# Patient Record
Sex: Male | Born: 1955 | Race: White | Hispanic: No | Marital: Married | Smoking: Light tobacco smoker
Health system: Southern US, Community
[De-identification: ages and names within clinical notes are randomized; demographics above are authoritative.]

## PROBLEM LIST (undated history)

## (undated) DIAGNOSIS — I10 Essential (primary) hypertension: Secondary | ICD-10-CM

## (undated) DIAGNOSIS — G459 Transient cerebral ischemic attack, unspecified: Secondary | ICD-10-CM

## (undated) DIAGNOSIS — C61 Malignant neoplasm of prostate: Secondary | ICD-10-CM

## (undated) DIAGNOSIS — Z973 Presence of spectacles and contact lenses: Secondary | ICD-10-CM

## (undated) HISTORY — DX: Essential (primary) hypertension: I10

## (undated) HISTORY — PX: PROSTATE BIOPSY: SHX241

---

## 2005-12-16 ENCOUNTER — Encounter: Admission: RE | Admit: 2005-12-16 | Discharge: 2005-12-16 | Payer: Self-pay | Admitting: Internal Medicine

## 2012-08-24 ENCOUNTER — Ambulatory Visit: Payer: Self-pay | Admitting: Family Medicine

## 2012-08-24 VITALS — BP 166/98 | HR 104 | Temp 98.1°F | Resp 20 | Ht 70.0 in | Wt 190.6 lb

## 2012-08-24 DIAGNOSIS — I10 Essential (primary) hypertension: Secondary | ICD-10-CM | POA: Insufficient documentation

## 2012-08-24 NOTE — Progress Notes (Signed)
@  [email or web address]@  Patient ID: SAL SPRATLEY MRN: [id number], DOB: October 13, 1956, 56 y.o. Date of Encounter: 08/24/2012, 11:56 AM  Primary Physician: No primary provider on file.  Chief Complaint: HTN  HPI: 56 y.o. year old male with history below presents for hypertension follow up.  No CP, HA, visual changes, or focal deficits.   Past Medical History  Diagnosis Date  . Hypertension      Home Meds: Prior to Admission medications   Not on File    Allergies: Not on File  History   Social History  . Marital Status: Married    Spouse Name: N/A    Number of Children: N/A  . Years of Education: N/A   Occupational History  . Not on file.   Social History Main Topics  . Smoking status: Light Tobacco Smoker -- 0.1 packs/day    Types: Cigarettes  . Smokeless tobacco: Not on file  . Alcohol Use: No  . Drug Use: No  . Sexually Active: Not on file   Other Topics Concern  . Not on file   Social History Narrative  . No narrative on file     Family History  Problem Relation Age of Onset  . Cancer Mother     Review of Systems: Constitutional: negative for chills, fever, night sweats, weight changes, or fatigue  HEENT: negative for vision changes, hearing loss, congestion, rhinorrhea, ST, epistaxis, or sinus pressure Cardiovascular: negative for chest pain, palpitations, or DOE Respiratory: negative for hemoptysis, wheezing, shortness of breath, or cough Abdominal: negative for abdominal pain, nausea, vomiting, diarrhea, or constipation Dermatological: negative for rash Neurologic: negative for headache, dizziness, or syncope All other systems reviewed and are otherwise negative with the exception to those above and in the HPI.   Physical Exam:  160/105 Blood pressure 166/98, pulse 104, temperature 98.1 F (36.7 C), temperature source Oral, resp. rate 20, height 5\' 10"  (1.778 m), weight 190 lb 9.6 oz (86.456 kg), SpO2 98.00%., Body mass index is 27.35 kg/(m^2). General:  Well developed, well nourished, in no acute distress. Head: Normocephalic, atraumatic, eyes without discharge, sclera non-icteric, nares are without discharge. Bilateral auditory canals clear, TM's are without perforation, pearly grey and translucent with reflective cone of light bilaterally. Oral cavity moist, posterior pharynx without exudate, erythema, peritonsillar abscess, or post nasal drip.  Neck: Supple. No thyromegaly. Full ROM. No lymphadenopathy. No carotid bruits. Lungs: Clear bilaterally to auscultation without wheezes, rales, or rhonchi. Breathing is unlabored. Heart: RRR with S1 S2. No murmurs, rubs, or gallops appreciated.  Abdomen: Soft, non-tender, non-distended with normoactive bowel sounds. No hepatosplenomegaly. No rebound/guarding. No obvious abdominal masses. Msk:  Strength and tone normal for age. Extremities/Skin: Warm and dry. No clubbing or cyanosis. No edema. No rashes or suspicious lesions. Distal pulses 2+ and equal bilaterally. Neuro: Alert and oriented X 3. Moves all extremities spontaneously. Gait is normal. CNII-XII grossly in tact. DTR 2+, cerebellar function intact. Rhomberg normal. Psych:  Responds to questions appropriately with a normal affect.    ASSESSMENT AND PLAN:  56 y.o. year old male with hypertension, no insurance, not compliant -  Signed, Elvina Sidle, MD 08/24/2012 11:56 AM

## 2016-07-16 DIAGNOSIS — Z23 Encounter for immunization: Secondary | ICD-10-CM | POA: Diagnosis not present

## 2016-11-11 DIAGNOSIS — F17210 Nicotine dependence, cigarettes, uncomplicated: Secondary | ICD-10-CM | POA: Diagnosis not present

## 2016-11-11 DIAGNOSIS — I10 Essential (primary) hypertension: Secondary | ICD-10-CM | POA: Diagnosis not present

## 2016-11-11 DIAGNOSIS — E7800 Pure hypercholesterolemia, unspecified: Secondary | ICD-10-CM | POA: Diagnosis not present

## 2017-01-14 DIAGNOSIS — I10 Essential (primary) hypertension: Secondary | ICD-10-CM | POA: Diagnosis not present

## 2017-01-14 DIAGNOSIS — Z1389 Encounter for screening for other disorder: Secondary | ICD-10-CM | POA: Diagnosis not present

## 2017-01-14 DIAGNOSIS — Z0000 Encounter for general adult medical examination without abnormal findings: Secondary | ICD-10-CM | POA: Diagnosis not present

## 2017-01-14 DIAGNOSIS — E7800 Pure hypercholesterolemia, unspecified: Secondary | ICD-10-CM | POA: Diagnosis not present

## 2017-01-14 DIAGNOSIS — Z1159 Encounter for screening for other viral diseases: Secondary | ICD-10-CM | POA: Diagnosis not present

## 2017-03-03 DIAGNOSIS — R972 Elevated prostate specific antigen [PSA]: Secondary | ICD-10-CM | POA: Diagnosis not present

## 2017-03-14 DIAGNOSIS — R972 Elevated prostate specific antigen [PSA]: Secondary | ICD-10-CM | POA: Diagnosis not present

## 2017-05-19 DIAGNOSIS — C61 Malignant neoplasm of prostate: Secondary | ICD-10-CM | POA: Diagnosis not present

## 2017-08-11 DIAGNOSIS — C61 Malignant neoplasm of prostate: Secondary | ICD-10-CM | POA: Diagnosis not present

## 2017-08-11 DIAGNOSIS — I10 Essential (primary) hypertension: Secondary | ICD-10-CM | POA: Diagnosis not present

## 2017-08-11 DIAGNOSIS — E7800 Pure hypercholesterolemia, unspecified: Secondary | ICD-10-CM | POA: Diagnosis not present

## 2017-08-11 DIAGNOSIS — Z23 Encounter for immunization: Secondary | ICD-10-CM | POA: Diagnosis not present

## 2017-12-24 DIAGNOSIS — C61 Malignant neoplasm of prostate: Secondary | ICD-10-CM | POA: Diagnosis not present

## 2017-12-25 ENCOUNTER — Other Ambulatory Visit: Payer: Self-pay | Admitting: Urology

## 2017-12-25 DIAGNOSIS — C61 Malignant neoplasm of prostate: Secondary | ICD-10-CM

## 2018-01-08 ENCOUNTER — Ambulatory Visit
Admission: RE | Admit: 2018-01-08 | Discharge: 2018-01-08 | Disposition: A | Discharge status: Home / Self Care | Payer: BLUE CROSS/BLUE SHIELD | Source: Ambulatory Visit | Attending: Urology | Admitting: Urology

## 2018-01-08 DIAGNOSIS — C61 Malignant neoplasm of prostate: Secondary | ICD-10-CM

## 2018-01-08 MED ORDER — GADOBENATE DIMEGLUMINE 529 MG/ML IV SOLN
15.0000 mL | Freq: Once | INTRAVENOUS | Status: AC | PRN
  Administered 2018-01-08: 15 mL via INTRAVENOUS

## 2018-01-12 ENCOUNTER — Encounter: Payer: Self-pay | Admitting: Radiation Oncology

## 2018-01-19 DIAGNOSIS — G459 Transient cerebral ischemic attack, unspecified: Secondary | ICD-10-CM

## 2018-01-19 HISTORY — DX: Transient cerebral ischemic attack, unspecified: G45.9

## 2018-01-23 NOTE — Progress Notes (Signed)
GU Location of Tumor / Histology: prostatic adenocarcinoma  If Prostate Cancer, Gleason Score is (3 + 4) and PSA is (4.53). Prostate volume: 24.7 mL  12/24/2017 PSA  5.68  Alex Banks was originally diagnosed with prostate cancer 03/14/2017.  Biopsies of prostate (if applicable) revealed:    Past/Anticipated interventions by urology, if any: biopsy, MRI of prostate/pelvis,referral to radiation oncology (pt interested in brachytherapy)  Past/Anticipated interventions by medical oncology, if any: no  Weight changes, if any: no  Bowel/Bladder complaints, if any: IPSS 1. Denies dysuria, hematuria, leakage or incontinence  Nausea/Vomiting, if any: no  Pain issues, if any:  no  SAFETY ISSUES:  Prior radiation? no  Pacemaker/ICD? no  Possible current pregnancy? no  Is the patient on methotrexate? no  Current Complaints / other details:  62 year old male. Married 38 years. Lives in [place]. Works 60 hour weeks. No much downtime.  Reports he has delayed pursuing treatment because he is busy with work. Hx of bone cancer in his brother. Hx of liver cancer in his mother. Restaging biopsy needed?

## 2018-01-26 ENCOUNTER — Ambulatory Visit
Admission: RE | Admit: 2018-01-26 | Discharge: 2018-01-26 | Disposition: A | Discharge status: Home / Self Care | Payer: BLUE CROSS/BLUE SHIELD | Source: Ambulatory Visit | Attending: Radiation Oncology | Admitting: Radiation Oncology

## 2018-01-26 ENCOUNTER — Encounter: Payer: Self-pay | Admitting: Radiation Oncology

## 2018-01-26 ENCOUNTER — Encounter: Payer: Self-pay | Admitting: Medical Oncology

## 2018-01-26 ENCOUNTER — Other Ambulatory Visit: Payer: Self-pay

## 2018-01-26 VITALS — BP 229/122 | HR 111 | Temp 98.2°F | Resp 20 | Ht 70.0 in | Wt 187.0 lb

## 2018-01-26 DIAGNOSIS — C61 Malignant neoplasm of prostate: Secondary | ICD-10-CM | POA: Diagnosis not present

## 2018-01-26 DIAGNOSIS — Z800 Family history of malignant neoplasm of digestive organs: Secondary | ICD-10-CM | POA: Diagnosis not present

## 2018-01-26 DIAGNOSIS — Z923 Personal history of irradiation: Secondary | ICD-10-CM | POA: Insufficient documentation

## 2018-01-26 DIAGNOSIS — Z808 Family history of malignant neoplasm of other organs or systems: Secondary | ICD-10-CM | POA: Diagnosis not present

## 2018-01-26 DIAGNOSIS — R972 Elevated prostate specific antigen [PSA]: Secondary | ICD-10-CM | POA: Diagnosis not present

## 2018-01-26 HISTORY — DX: Malignant neoplasm of prostate: C61

## 2018-01-26 NOTE — Progress Notes (Signed)
See progress note under physician encounter. 

## 2018-01-26 NOTE — Progress Notes (Signed)
Introduced myself to Alex Banks and his wife as the prostate nurse navigator and my role. He had a prostate biopsy 01/02/17. He had PSA repeatd 12/24/17 and MRI that did not show metastatic disease. He is interested in brachytherapy. We discussed the procedure and what to expect. I gave them my card and asked them to call with questions and or concerns.

## 2018-01-26 NOTE — Progress Notes (Signed)
Radiation Oncology         [phone] ________________________________  Initial Outpatient Consultation  Name: Alex Banks MRN: [id number]  Date: 01/26/2018  DOB: 10-07-1956  HK:[id number], Denton Ar, MD  Franchot Gallo, MD   REFERRING PHYSICIAN: Franchot Gallo, MD  DIAGNOSIS: 62 y.o. 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 Malignant neoplasm of prostate ([place]) C61     HISTORY OF PRESENT ILLNESS: Alex Banks is a 62 y.o. male with a diagnosis of prostate cancer. He was noted to have an elevated PSA of 4.53 in March 2018 by his primary care physician, Dr. Wenda Low.  Accordingly, he was referred for evaluation in urology by Dr. Diona Fanti in May 2018, where digital rectal examination was performed at that time revealing a 4 mm right-sided prostate nodule. His PSA was rechecked and remained elevated at 4.49. The patient proceeded to transrectal ultrasound with 12 biopsies of the prostate on 03/14/2017.  The prostate volume measured 24.66 cc.  Out of 12 core biopsies, 5 were positive.  The maximum Gleason score was 3+4, and this was seen in the right mid, right apex, right base lateral, right mid lateral, and right apex lateral.     The patient reviewed the biopsy results and treatment options with Dr. Diona Fanti but has not received any treatment since his diagnosis in May 2018. He states that he works 60-hour weeks and has delayed pursuing treatment because he is so busy with work. He returned to Dr. Diona Fanti on 12/24/2017 for follow-up and was noted to have a 5 mm nodule in the right mid gland on DRE. He also had a PSA drawn at that time that was 5.68. He has not had a restaging biopsy but was evaluated with an MRI of the prostate on 01/08/2018 which showed a 16 mm lesion in the right posterolateral mid gland. There was no evidence of extracapsular extension, seminal vesicle  invasion, or metastatic disease. He has been referred by Dr. Diona Fanti today for discussion of potential radiation treatment options.   PREVIOUS RADIATION THERAPY: No  PAST MEDICAL HISTORY:  Past Medical History:  Diagnosis Date  . Hypertension   . Prostate cancer ([place])       PAST SURGICAL HISTORY: Past Surgical History:  Procedure Laterality Date  . PROSTATE BIOPSY      FAMILY HISTORY:  Family History  Problem Relation Age of Onset  . Cancer Mother        liver  . Cancer Brother        bone    SOCIAL HISTORY:  Social History   Socioeconomic History  . Marital status: Married    Spouse name: Not on file  . Number of children: Not on file  . Years of education: Not on file  . Highest education level: Not on file  Occupational History    Comment: manager  Social Needs  . Financial resource strain: Not on file  . Food insecurity:    Worry: Not on file    Inability: Not on file  . Transportation needs:    Medical: Not on file    Non-medical: Not on file  Tobacco Use  . Smoking status: Light Tobacco Smoker    Packs/day: 0.10    Years: 44.00    Pack years: 4.40    Types: Cigarettes  . Smokeless tobacco: Never Used  Substance and Sexual Activity  . Alcohol  use: No  . Drug use: No  . Sexual activity: Not on file  Lifestyle  . Physical activity:    Days per week: Not on file    Minutes per session: Not on file  . Stress: Not on file  Relationships  . Social connections:    Talks on phone: Not on file    Gets together: Not on file    Attends religious service: Not on file    Active member of club or organization: Not on file    Attends meetings of clubs or organizations: Not on file    Relationship status: Not on file  . Intimate partner violence:    Fear of current or ex partner: Not on file    Emotionally abused: Not on file    Physically abused: Not on file    Forced sexual activity: Not on file  Other Topics Concern  . Not on file  Social History  Narrative   Resides in [place]. One grown son in [place] and the other in [place]. No grandchildren.    ALLERGIES: Patient has no known allergies.  MEDICATIONS:  Current Outpatient Medications  Medication Sig Dispense Refill  . AMLODIPINE BESYLATE PO Take 10 mg by mouth daily.    Marland Kitchen atorvastatin (LIPITOR) 20 MG tablet Take 20 mg by mouth daily.    Marland Kitchen lisinopril-hydrochlorothiazide (PRINZIDE,ZESTORETIC) 20-25 MG tablet Take 1 tablet by mouth daily.     No current facility-administered medications for this encounter.     REVIEW OF SYSTEMS:  On review of systems, the patient reports that he is doing well overall. He denies any chest pain, shortness of breath, cough, fevers, chills, night sweats, or unintended weight changes. He denies any bowel disturbances, and denies abdominal pain, nausea or vomiting. He denies any new musculoskeletal or joint aches or pains. His IPSS was 1, indicating mild urinary symptoms. He denies any dysuria, hematuria, leakage or incontinence. He denies erectile dysfunction. A complete review of systems is obtained and is otherwise negative.    PHYSICAL EXAM:  Wt Readings from Last 3 Encounters:  01/26/18 187 lb (84.8 kg)  08/24/12 190 lb 9.6 oz (86.5 kg)   Temp Readings from Last 3 Encounters:  01/26/18 98.2 F (36.8 C) (Oral)  08/24/12 98.1 F (36.7 C) (Oral)   BP Readings from Last 3 Encounters:  01/26/18 (!) 229/122  08/24/12 (!) 166/98   Pulse Readings from Last 3 Encounters:  01/26/18 (!) 111  08/24/12 (!) 104   Pain Assessment Pain Score: 0-No pain/10  In general this is a well appearing caucasian male in no acute distress. He's alert and oriented x4 and appropriate throughout the examination. Cardiopulmonary assessment is negative for acute distress and he exhibits normal effort.    KPS = 100  100 - Normal; no complaints; no evidence of disease. 90   - Able to carry on normal activity; minor signs or symptoms of disease. 80   -  Normal activity with effort; some signs or symptoms of disease. 52   - Cares for self; unable to carry on normal activity or to do active work. 60   - Requires occasional assistance, but is able to care for most of his personal needs. 50   - Requires considerable assistance and frequent medical care. 70   - Disabled; requires special care and assistance. 40   - Severely disabled; hospital admission is indicated although death not imminent. 18   - Very sick; hospital admission necessary; active supportive treatment necessary.  10   - Moribund; fatal processes progressing rapidly. 0     - Dead  Karnofsky DA, Abelmann WH, Craver LS and Burchenal JH [phone]) The use of the nitrogen mustards in the palliative treatment of carcinoma: with particular reference to bronchogenic carcinoma Cancer 1 634-56  LABORATORY DATA:  No results found for: WBC, HGB, HCT, MCV, PLT No results found for: NA, K, CL, CO2 No results found for: ALT, AST, GGT, ALKPHOS, BILITOT   RADIOGRAPHY: Mr Prostate W Wo Contrast  Result Date: 01/08/2018 CLINICAL DATA:  Prostate cancer, Gleason score 7, extending from the right base to the apex EXAM: MR PROSTATE WITHOUT AND WITH CONTRAST TECHNIQUE: Multiplanar multisequence MRI images were obtained of the pelvis centered about the prostate. Pre and post contrast images were obtained. CONTRAST:  78mL MULTIHANCE GADOBENATE DIMEGLUMINE 529 MG/ML IV SOLN Creatinine was obtained on site at [facility] at [address] Results: Creatinine 1.1 mg/dL. COMPARISON:  None. FINDINGS: Prostate: 16 x 9 x 12 mm low T2 lesion in the right posterolateral mid gland (series 7/image 11), likely corresponding to the patient's known high-grade macroscopic prostate cancer. Associated early arterial enhancement (series 34/image 11). Possible mild restricted diffusion/low ADC towards the apex (series 11/image 13), although equivocal. Volume: 4.7 x 3.5 x 3.3 cm (calculated volume 23.1 mL) Transcapsular  spread: Broad base of extracapsular contact, without extracapsular extension. Seminal vesicle involvement: Absent. Neurovascular bundle involvement: Absent. Pelvic adenopathy: Absent. Bone metastasis: Absent. Other findings: None. IMPRESSION: 16 mm lesion in the right posterolateral mid gland, likely corresponding to the patient's known high-grade macroscopic prostate cancer. No evidence of gross extracapsular extension, seminal vesicle invasion, or metastatic disease. Electronically Signed   By: Julian Hy M.D.   On: 01/08/2018 16:05      IMPRESSION/PLAN: 1. 62 y.o. 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 We discussed the patient's workup and outlined the nature of prostate cancer in this setting. The patient's T stage, Gleason's score, and PSA put him into the intermediate risk group. Accordingly, he is eligible for a variety of potential treatment options including prostatectomy, brachytherapy, or external radiation. We discussed the available radiation techniques, and focused on the details and logistics and delivery. We discussed and outlined the risks, benefits, short and long-term effects associated with radiotherapy and compared and contrasted these with prostatectomy. We discussed the role of SpaceOAR in reducing the rectal toxicity associated with radiotherapy. At the end of the conversation the patient is interested in moving forward with brachytherapy with SpaceOAR gel.  We will share our discussion with Dr. Diona Fanti and move forward with scheduling his preseed planning scan in the near future.  The patient will be called tomorrow by Romie Jumper in our office who will be working closely with him to coordinate OR scheduling and pre and post procedure appointments.  He will have a prostate MRI following his post-seed CT SIM to confirm appropriate distribution of the [place].  In a visit lasting 60  minutes, greater than 50% of the time was spent face to face discussing his case, and coordinating the patient's care.     Carola Rhine, PAC &  Sheral Apley. Tammi Klippel, M.D.  This document serves as a record of services personally performed by Tyler Pita, MD and Shona Simpson, PA-C. It was created on their behalf by Rae Lips, a trained medical scribe. The creation of this record is based on the scribe's personal observations and  the providers' statements to them. This document has been checked and approved by the attending providers.

## 2018-01-27 ENCOUNTER — Telehealth: Payer: Self-pay | Admitting: *Deleted

## 2018-01-27 NOTE — Telephone Encounter (Signed)
Called patient to ask question, lvm for a return call 

## 2018-01-28 DIAGNOSIS — C61 Malignant neoplasm of prostate: Secondary | ICD-10-CM | POA: Insufficient documentation

## 2018-02-06 ENCOUNTER — Inpatient Hospital Stay: Payer: BLUE CROSS/BLUE SHIELD

## 2018-02-06 ENCOUNTER — Emergency Department: Payer: BLUE CROSS/BLUE SHIELD

## 2018-02-06 ENCOUNTER — Other Ambulatory Visit: Payer: Self-pay

## 2018-02-06 ENCOUNTER — Inpatient Hospital Stay
Admission: EM | Admit: 2018-02-06 | Discharge: 2018-02-08 | DRG: 069 | Disposition: A | Discharge status: Home / Self Care | Payer: BLUE CROSS/BLUE SHIELD | Attending: Internal Medicine | Admitting: Internal Medicine

## 2018-02-06 ENCOUNTER — Encounter: Payer: Self-pay | Admitting: Emergency Medicine

## 2018-02-06 DIAGNOSIS — H532 Diplopia: Secondary | ICD-10-CM | POA: Diagnosis not present

## 2018-02-06 DIAGNOSIS — Z800 Family history of malignant neoplasm of digestive organs: Secondary | ICD-10-CM | POA: Diagnosis not present

## 2018-02-06 DIAGNOSIS — I472 Ventricular tachycardia: Secondary | ICD-10-CM | POA: Diagnosis not present

## 2018-02-06 DIAGNOSIS — Z808 Family history of malignant neoplasm of other organs or systems: Secondary | ICD-10-CM | POA: Diagnosis not present

## 2018-02-06 DIAGNOSIS — Z79899 Other long term (current) drug therapy: Secondary | ICD-10-CM

## 2018-02-06 DIAGNOSIS — H4900 Third [oculomotor] nerve palsy, unspecified eye: Secondary | ICD-10-CM | POA: Diagnosis present

## 2018-02-06 DIAGNOSIS — F17210 Nicotine dependence, cigarettes, uncomplicated: Secondary | ICD-10-CM | POA: Diagnosis present

## 2018-02-06 DIAGNOSIS — Z9119 Patient's noncompliance with other medical treatment and regimen: Secondary | ICD-10-CM | POA: Diagnosis not present

## 2018-02-06 DIAGNOSIS — I161 Hypertensive emergency: Secondary | ICD-10-CM | POA: Diagnosis present

## 2018-02-06 DIAGNOSIS — Z7982 Long term (current) use of aspirin: Secondary | ICD-10-CM | POA: Diagnosis not present

## 2018-02-06 DIAGNOSIS — E782 Mixed hyperlipidemia: Secondary | ICD-10-CM | POA: Diagnosis not present

## 2018-02-06 DIAGNOSIS — I10 Essential (primary) hypertension: Secondary | ICD-10-CM | POA: Diagnosis not present

## 2018-02-06 DIAGNOSIS — G459 Transient cerebral ischemic attack, unspecified: Principal | ICD-10-CM | POA: Diagnosis present

## 2018-02-06 DIAGNOSIS — F419 Anxiety disorder, unspecified: Secondary | ICD-10-CM | POA: Diagnosis present

## 2018-02-06 DIAGNOSIS — R4781 Slurred speech: Secondary | ICD-10-CM | POA: Diagnosis present

## 2018-02-06 DIAGNOSIS — R42 Dizziness and giddiness: Secondary | ICD-10-CM | POA: Diagnosis not present

## 2018-02-06 DIAGNOSIS — I6523 Occlusion and stenosis of bilateral carotid arteries: Secondary | ICD-10-CM | POA: Diagnosis present

## 2018-02-06 DIAGNOSIS — I639 Cerebral infarction, unspecified: Secondary | ICD-10-CM

## 2018-02-06 DIAGNOSIS — R319 Hematuria, unspecified: Secondary | ICD-10-CM | POA: Diagnosis present

## 2018-02-06 DIAGNOSIS — I6602 Occlusion and stenosis of left middle cerebral artery: Secondary | ICD-10-CM | POA: Diagnosis not present

## 2018-02-06 DIAGNOSIS — I471 Supraventricular tachycardia: Secondary | ICD-10-CM | POA: Diagnosis not present

## 2018-02-06 DIAGNOSIS — I63233 Cerebral infarction due to unspecified occlusion or stenosis of bilateral carotid arteries: Secondary | ICD-10-CM | POA: Diagnosis not present

## 2018-02-06 DIAGNOSIS — Z716 Tobacco abuse counseling: Secondary | ICD-10-CM

## 2018-02-06 DIAGNOSIS — E876 Hypokalemia: Secondary | ICD-10-CM | POA: Diagnosis present

## 2018-02-06 DIAGNOSIS — C61 Malignant neoplasm of prostate: Secondary | ICD-10-CM | POA: Diagnosis present

## 2018-02-06 DIAGNOSIS — I7389 Other specified peripheral vascular diseases: Secondary | ICD-10-CM | POA: Diagnosis present

## 2018-02-06 DIAGNOSIS — R350 Frequency of micturition: Secondary | ICD-10-CM | POA: Diagnosis present

## 2018-02-06 DIAGNOSIS — R29701 NIHSS score 1: Secondary | ICD-10-CM | POA: Diagnosis not present

## 2018-02-06 LAB — URINALYSIS, ROUTINE W REFLEX MICROSCOPIC
Bilirubin Urine: NEGATIVE
Glucose, UA: NEGATIVE mg/dL
HGB URINE DIPSTICK: NEGATIVE
KETONES UR: NEGATIVE mg/dL
LEUKOCYTES UA: NEGATIVE
Nitrite: NEGATIVE
PROTEIN: NEGATIVE mg/dL
Specific Gravity, Urine: 1.011 (ref 1.005–1.030)
pH: 7 (ref 5.0–8.0)

## 2018-02-06 LAB — URINE DRUG SCREEN, QUALITATIVE (ARMC ONLY)
Amphetamines, Ur Screen: NOT DETECTED
BARBITURATES, UR SCREEN: NOT DETECTED
BENZODIAZEPINE, UR SCRN: NOT DETECTED
Cannabinoid 50 Ng, Ur ~~LOC~~: NOT DETECTED
Cocaine Metabolite,Ur ~~LOC~~: NOT DETECTED
MDMA (Ecstasy)Ur Screen: NOT DETECTED
METHADONE SCREEN, URINE: NOT DETECTED
OPIATE, UR SCREEN: NOT DETECTED
PHENCYCLIDINE (PCP) UR S: NOT DETECTED
Tricyclic, Ur Screen: NOT DETECTED

## 2018-02-06 LAB — APTT: aPTT: 27 seconds (ref 24–36)

## 2018-02-06 LAB — GLUCOSE, CAPILLARY
GLUCOSE-CAPILLARY: 117 mg/dL — AB (ref 65–99)
Glucose-Capillary: 116 mg/dL — ABNORMAL HIGH (ref 65–99)

## 2018-02-06 LAB — CBC
HEMATOCRIT: 49.7 % (ref 40.0–52.0)
HEMOGLOBIN: 17.2 g/dL (ref 13.0–18.0)
MCH: 30.5 pg (ref 26.0–34.0)
MCHC: 34.6 g/dL (ref 32.0–36.0)
MCV: 88.1 fL (ref 80.0–100.0)
Platelets: 217 10*3/uL (ref 150–440)
RBC: 5.64 MIL/uL (ref 4.40–5.90)
RDW: 14.6 % — ABNORMAL HIGH (ref 11.5–14.5)
WBC: 12.8 10*3/uL — ABNORMAL HIGH (ref 3.8–10.6)

## 2018-02-06 LAB — COMPREHENSIVE METABOLIC PANEL
ALK PHOS: 69 U/L (ref 38–126)
ALT: 24 U/L (ref 17–63)
ANION GAP: 10 (ref 5–15)
AST: 22 U/L (ref 15–41)
Albumin: 5 g/dL (ref 3.5–5.0)
BUN: 15 mg/dL (ref 6–20)
CALCIUM: 9.6 mg/dL (ref 8.9–10.3)
CO2: 26 mmol/L (ref 22–32)
Chloride: 99 mmol/L — ABNORMAL LOW (ref 101–111)
Creatinine, Ser: 0.87 mg/dL (ref 0.61–1.24)
GFR calc Af Amer: >60 mL/min (ref >60)
GLUCOSE: 119 mg/dL — AB (ref 65–99)
POTASSIUM: 3.2 mmol/L — AB (ref 3.5–5.1)
Sodium: 135 mmol/L (ref 135–145)
TOTAL PROTEIN: 8.5 g/dL — AB (ref 6.5–8.1)
Total Bilirubin: 0.8 mg/dL (ref 0.3–1.2)

## 2018-02-06 LAB — DIFFERENTIAL
Basophils Absolute: 0.1 10*3/uL (ref 0–0.1)
Basophils Relative: 1 %
EOS ABS: 0.1 10*3/uL (ref 0–0.7)
EOS PCT: 1 %
LYMPHS PCT: 11 %
Lymphs Abs: 1.4 10*3/uL (ref 1.0–3.6)
MONO ABS: 0.8 10*3/uL (ref 0.2–1.0)
MONOS PCT: 6 %
NEUTROS PCT: 81 %
Neutro Abs: 10.4 10*3/uL — ABNORMAL HIGH (ref 1.4–6.5)

## 2018-02-06 LAB — PROTIME-INR
INR: 0.94
Prothrombin Time: 12.5 seconds (ref 11.4–15.2)

## 2018-02-06 LAB — ETHANOL: Alcohol, Ethyl (B): <10 mg/dL (ref <10)

## 2018-02-06 LAB — MAGNESIUM: MAGNESIUM: 2.1 mg/dL (ref 1.7–2.4)

## 2018-02-06 LAB — TROPONIN I

## 2018-02-06 MED ORDER — LISINOPRIL 20 MG PO TABS
20.0000 mg | ORAL_TABLET | Freq: Every day | ORAL | Status: DC
  Administered 2018-02-07 – 2018-02-08 (×2): 20 mg via ORAL
  Filled 2018-02-06 (×3): qty 1

## 2018-02-06 MED ORDER — ACETAMINOPHEN 650 MG RE SUPP: 650.0000 mg | RECTAL | Status: DC | PRN

## 2018-02-06 MED ORDER — ASPIRIN 81 MG PO CHEW
324.0000 mg | CHEWABLE_TABLET | Freq: Once | ORAL | Status: AC
  Administered 2018-02-06: 324 mg via ORAL
  Filled 2018-02-06: qty 4

## 2018-02-06 MED ORDER — SODIUM CHLORIDE 0.9 % IV SOLN
INTRAVENOUS | Status: DC
  Administered 2018-02-06 – 2018-02-07 (×2): via INTRAVENOUS

## 2018-02-06 MED ORDER — ATORVASTATIN CALCIUM 20 MG PO TABS: 40.0000 mg | ORAL_TABLET | Freq: Every day | ORAL | Status: DC

## 2018-02-06 MED ORDER — LISINOPRIL-HYDROCHLOROTHIAZIDE 20-25 MG PO TABS: 1.0000 | ORAL_TABLET | Freq: Every day | ORAL | Status: DC

## 2018-02-06 MED ORDER — IOPAMIDOL (ISOVUE-370) INJECTION 76%
100.0000 mL | Freq: Once | INTRAVENOUS | Status: AC | PRN
  Administered 2018-02-06: 100 mL via INTRAVENOUS

## 2018-02-06 MED ORDER — ASPIRIN 300 MG RE SUPP: 300.0000 mg | Freq: Every day | RECTAL | Status: DC

## 2018-02-06 MED ORDER — ACETAMINOPHEN 325 MG PO TABS: 650.0000 mg | ORAL_TABLET | ORAL | Status: DC | PRN

## 2018-02-06 MED ORDER — ASPIRIN EC 325 MG PO TBEC
325.0000 mg | DELAYED_RELEASE_TABLET | Freq: Every day | ORAL | Status: DC
  Administered 2018-02-07: 325 mg via ORAL
  Filled 2018-02-06 (×2): qty 1

## 2018-02-06 MED ORDER — AMLODIPINE BESYLATE 10 MG PO TABS
10.0000 mg | ORAL_TABLET | Freq: Every day | ORAL | Status: DC
  Administered 2018-02-07 – 2018-02-08 (×2): 10 mg via ORAL
  Filled 2018-02-06 (×2): qty 1

## 2018-02-06 MED ORDER — HYDROCHLOROTHIAZIDE 25 MG PO TABS
25.0000 mg | ORAL_TABLET | Freq: Every day | ORAL | Status: DC
Start: 2018-02-06 — End: 2018-02-08
  Administered 2018-02-07 – 2018-02-08 (×2): 25 mg via ORAL
  Filled 2018-02-06 (×2): qty 1

## 2018-02-06 MED ORDER — ALTEPLASE 100 MG IV SOLR
INTRAVENOUS | Status: AC
  Filled 2018-02-06: qty 100

## 2018-02-06 MED ORDER — ACETAMINOPHEN 160 MG/5ML PO SOLN
650.0000 mg | ORAL | Status: DC | PRN
  Filled 2018-02-06: qty 20.3

## 2018-02-06 MED ORDER — NICOTINE 14 MG/24HR TD PT24: 14.0000 mg | MEDICATED_PATCH | Freq: Every day | TRANSDERMAL | Status: DC

## 2018-02-06 MED ORDER — ENOXAPARIN SODIUM 40 MG/0.4ML ~~LOC~~ SOLN
40.0000 mg | SUBCUTANEOUS | Status: DC
  Administered 2018-02-06 – 2018-02-07 (×2): 40 mg via SUBCUTANEOUS
  Filled 2018-02-06 (×2): qty 0.4

## 2018-02-06 MED ORDER — POTASSIUM CHLORIDE CRYS ER 20 MEQ PO TBCR
40.0000 meq | EXTENDED_RELEASE_TABLET | Freq: Once | ORAL | Status: AC
  Administered 2018-02-06: 17:00:00 40 meq via ORAL
  Filled 2018-02-06: qty 2

## 2018-02-06 MED ORDER — LABETALOL HCL 5 MG/ML IV SOLN
10.0000 mg | Freq: Once | INTRAVENOUS | Status: AC
  Administered 2018-02-06: 10 mg via INTRAVENOUS
  Filled 2018-02-06: qty 4

## 2018-02-06 MED ORDER — STROKE: EARLY STAGES OF RECOVERY BOOK
Freq: Once | Status: AC
  Administered 2018-02-06: 17:00:00

## 2018-02-06 MED ORDER — SENNOSIDES-DOCUSATE SODIUM 8.6-50 MG PO TABS: 1.0000 | ORAL_TABLET | Freq: Every evening | ORAL | Status: DC | PRN

## 2018-02-06 NOTE — ED Triage Notes (Signed)
ARrives with c/o double vision and dizziness  x 3 hours

## 2018-02-06 NOTE — Progress Notes (Signed)
Chaplain responded to Code Stroke in ED to find wife participating in a consult with a neurologist. Patient was out of the room receiving a CT scan. Chaplain provided active listening and emotional support until the patient returned. Silent prayer was offered. Chaplain will return if needed.

## 2018-02-06 NOTE — Progress Notes (Signed)
CODE STROKE- PHARMACY COMMUNICATION   Time CODE STROKE called/page received: 14:12  Time response to CODE STROKE was made (in person or via phone):   Time Stroke Kit retrieved from [facility] (only if needed):  Name of Provider/Nurse contacted: Kailey at 14:14  Past Medical History:  Diagnosis Date  . Hypertension   . Prostate cancer [facility])    Prior to Admission medications   Medication Sig Start Date End Date Taking? Authorizing Provider  AMLODIPINE BESYLATE PO Take 10 mg by mouth daily.    [provider]  atorvastatin (LIPITOR) 20 MG tablet Take 20 mg by mouth daily.    [provider]  lisinopril-hydrochlorothiazide (PRINZIDE,ZESTORETIC) 20-25 MG tablet Take 1 tablet by mouth daily.    [provider]    Laural Benes ,PharmD Clinical Pharmacist  02/06/2018  2:13 PM

## 2018-02-06 NOTE — ED Notes (Signed)
Neurologist Lucia Gaskins gave verbal order for 10mg  of labetalol to be given. See [facility]

## 2018-02-06 NOTE — Code Documentation (Signed)
Pt arrives with complaints of diplopia in his right eye and dizziness, LKW 1100, pt brought to room 25 from triage for EDP evaluation, Dr. Owens Shark at bedside at 1405, Code Stroke activated, pt taken to CT for non con head CT, CTA, and CTP, NIHSS 1, pt demonstrates a right dysconjugate gaze, tPA discussed with pt, wife and Dr. Owens Shark and Dr. Lucia Gaskins, decision against tPA made, discussion for admission discussed, wife at bedside, report off to [facility]

## 2018-02-06 NOTE — ED Notes (Signed)
Neurologist and MD Owens Shark consulting with family over decision for tpa. Pt and family agree to no tpa.

## 2018-02-06 NOTE — Consult Note (Signed)
[facility] TeleNeurology Consult Services  Date of Service: 02/06/2018  Impression:  1. 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 2. Multiple remote lacunar infarctions 3. Moderate stenosis right internal carotid origin.   Not a tPA candidate due to: outside of best three hour window of opportunity, this type of neurologic deficit has a reasonably good chance of resolving itself over the next 12 weeks and if not complete can be treated with optics (prism lenses), and since we do not know the status of his prostate cancer it adds an extra risk Not clearly an NIR candidate due to: no evidence of large vessel occlusion is demonstrated by CTA and perfusion studies  Differential Diagnosis:   1. Cardioembolic stroke  2. Small vessel disease/lacune  3. Thromboembolic, artery-to-artery mechanism  4. Hypercoagulable state-related infarct  5. Transient ischemic attack  6. Thrombotic mechanism, large artery disease    Comments:   [facility] contacted: 1412 [facility] at bedside: 1422 NIHSS assessment time: 1441  Recommendations:   Inpatient Neurology consultation Stroke evaluation per Neurology/Internal Medicine Discussed with ED MD MRI brain LDL tele a1c TTE DVT proph  permissive htn bedside swallow eval PT/OT/speech ASA smoking cessation, lifestyle changes  Please call with questions.  ----------------------------------------------------------------------------------------- CC: double vision  History of Present Illness: this is a 62 year old man who was well until 1100 this morning when he developed double vision, dizziness, and his wife thought he had some slurring of his speech. When she saw him he had discounted gaze. He has a history of hypertension has been noncompliant on off with his medication. On arrival his blood pressure is 194/100 then 155/95. He has no history of diabetes. He has no history of coronary  disease or atrial fibrillation. He has hyperlipidemia. He is supposed to take low-dose aspirin but stopped taking it. He is a one half pack per day smoker. He has prostate cancer scheduled to have seeds but the actual size and status of the tumor is not known.   Diagnostic: CT brain showed no evidence of acute ischemic stroke, hemorrhage, or mass lesion but he has multiple lacunar infarctions including the anterior right thalamus, left frontal lobe, right temporal stem, and right parietal white matter. CTA did not reveal thrombus but he has moderate stenosis estimated 50 to 69% of the right internal carotid origin.  Exam  Level of consciousness: alert, keenly responsive = 0 Level of consciousness: month and age: both questions correct = 0 Level of consciousness: commands: performs both tasks = 0 Horizontal EOMs: normal = 1 pupillary sparing right third nerve palsy (there was no nystagmus the right I to suggest internuclear ophthalmoplegia) Visual fields: normal = 0 Test facial palsy: normal symmetry = 0 L arm motor drift: no drift for 10 seconds = 0 R arm motor drift: no drift for 10 seconds = 0 L leg motor drift: no drift for five seconds = 0 R leg motor drift: no drift five seconds = 0 Limb ataxia: (FNF/Heel-Shin): no ataxia = 0 Sensation: normal; no sensory loss = 0 Language/Aphasia: normal: no aphasia = 0 Dysarthria: normal = 0 Extinction/Inattention = 0  NIHSS = 1   Medical Decision Making:  - Extensive number of diagnosis or management options are considered above.   - Extensive amount of complex data reviewed.   - High risk of complication and/or morbidity or mortality are associated with differential diagnostic considerations above.  - There may be Uncertain outcome and increased probability of prolonged functional impairment  or high probability of severe prolonged functional impairment associated with some of these differential diagnosis.   Medical Data Reviewed:  1.Data  reviewed include clinical labs, radiology,  Medical Tests;   2.Tests results discussed w/performing or interpreting physician;   3.Obtaining/reviewing old medical records;  4.Obtaining case history from another source;  5.Independent review of image, tracing or specimen.    Patient was informed the Neurology Consult would happen via telehealth (remote video) and consented to receiving care in this manner.

## 2018-02-06 NOTE — ED Provider Notes (Signed)
[facility] Emergency Department Provider Note   First MD Initiated Contact with Patient 02/06/18 1414     (approximate)  I have reviewed the triage vital signs and the nursing notes.   HISTORY  Chief Complaint Diplopia    HPI Alex Banks is a 62 y.o. male with globus of chronic medical conditions presents to the emergency department with a three-hour history of dizziness and double vision. Patient denies any weakness no numbness. Patient denies any headache. Patient noted to be hypertensive with a blood pressure of 194/104 on arrival   Past Medical History:  Diagnosis Date  . Hypertension   . Prostate cancer [facility])     Patient Active Problem List   Diagnosis Date Noted  . Malignant neoplasm of prostate ([place]) 01/28/2018  . Hypertension 08/24/2012    Past Surgical History:  Procedure Laterality Date  . PROSTATE BIOPSY      Prior to Admission medications   Medication Sig Start Date End Date Taking? Authorizing Provider  AMLODIPINE BESYLATE PO Take 10 mg by mouth daily.    [provider]  atorvastatin (LIPITOR) 20 MG tablet Take 20 mg by mouth daily.    [provider]  lisinopril-hydrochlorothiazide (PRINZIDE,ZESTORETIC) 20-25 MG tablet Take 1 tablet by mouth daily.    [provider]    Allergies no known drug allergies  Family History  Problem Relation Age of Onset  . Cancer Mother        liver  . Cancer Brother        bone    Social History Social History   Tobacco Use  . Smoking status: Light Tobacco Smoker    Packs/day: 0.10    Years: 44.00    Pack years: 4.40    Types: Cigarettes  . Smokeless tobacco: Never Used  Substance Use Topics  . Alcohol use: No  . Drug use: No    Review of Systems Constitutional: No fever/chills Eyes: positive for double vision ENT: No sore throat. Cardiovascular: Denies chest pain. Respiratory: Denies shortness of breath. Gastrointestinal: No abdominal  pain.  No nausea, no vomiting.  No diarrhea.  No constipation. Genitourinary: Negative for dysuria. Musculoskeletal: Negative for neck pain.  Negative for back pain. Integumentary: Negative for rash. Neurological: positive for double vision, positive for dizziness   ____________________________________________   PHYSICAL EXAM:  VITAL SIGNS: ED Triage Vitals  Enc Vitals Group     BP 02/06/18 1416 (!) 194/104     Pulse --      Resp --      Temp --      Temp src --      SpO2 --      Weight 02/06/18 1407 84.8 kg (187 lb)     Height 02/06/18 1407 1.778 m (5\' 10" )     Head Circumference --      Peak Flow --      Pain Score 02/06/18 1407 0     Pain Loc --      Pain Edu? --      Excl. in [place]? --     Constitutional: Alert and oriented. Well appearing and in no acute distress. Eyes: Conjunctivae are normal. disconjugate gaze right eye lateral gaze position while left eye looking forward Head: Atraumatic. Mouth/Throat: Mucous membranes are moist.  Oropharynx non-erythematous. Neck: No stridor.  No meningeal signs. No cervical spine tenderness to palpation. Cardiovascular: Normal rate, regular rhythm. Good peripheral circulation. Grossly normal heart sounds. Respiratory: Normal respiratory effort.  No  retractions. Lungs CTAB. Gastrointestinal: Soft and nontender. No distention.  Musculoskeletal: No lower extremity tenderness nor edema. No gross deformities of extremities. Neurologic:  Normal speech and language. No gross focal neurologic deficits are appreciated.  Skin:  Skin is warm, dry and intact. No rash noted. Psychiatric: Mood and affect are normal. Speech and behavior are normal.  ____________________________________________   LABS (all labs ordered are listed, but only abnormal results are displayed)  Labs Reviewed  GLUCOSE, CAPILLARY - Abnormal; Notable for the following components:      Result Value   Glucose-Capillary 116 (*)    All other components within normal  limits  CBC - Abnormal; Notable for the following components:   WBC 12.8 (*)    RDW 14.6 (*)    All other components within normal limits  DIFFERENTIAL - Abnormal; Notable for the following components:   Neutro Abs 10.4 (*)    All other components within normal limits  COMPREHENSIVE METABOLIC PANEL - Abnormal; Notable for the following components:   Potassium 3.2 (*)    Chloride 99 (*)    Glucose, Bld 119 (*)    Total Protein 8.5 (*)    All other components within normal limits  ETHANOL  PROTIME-INR  APTT  TROPONIN I  URINE DRUG SCREEN, QUALITATIVE ([facility] ONLY)  URINALYSIS, ROUTINE W REFLEX MICROSCOPIC   ____________________________________________  EKG  ED ECG REPORT I, [place] N BROWN, the attending physician, personally viewed and interpreted this ECG.   Date: 02/06/2018  EKG Time: 2:42 PM  Rate: 88  Rhythm: Normal sinus rhythm  Axis: normal  Intervals:normal  ST&T Change: none  ____________________________________________  RADIOLOGY I, [place] Ernst Bowler, personally viewed and evaluated these images (plain radiographs) as part of my medical decision making, as well as reviewing the written report by the radiologist.  ED MD interpretation:    Official radiology report(s): Ct Angio Head W Or Wo Contrast  Result Date: 02/06/2018 CLINICAL DATA:  Diplopia and dizziness. Third nerve palsy. Concern for posterior circulation/brainstem infarct. EXAM: CT ANGIOGRAPHY HEAD AND NECK CT PERFUSION BRAIN TECHNIQUE: Multidetector CT imaging of the head and neck was performed using the standard protocol during bolus administration of intravenous contrast. Multiplanar CT image reconstructions and MIPs were obtained to evaluate the vascular anatomy. Carotid stenosis measurements (when applicable) are obtained utilizing NASCET criteria, using the distal internal carotid diameter as the denominator. Multiphase CT imaging of the brain was performed following IV bolus contrast injection.  Subsequent parametric perfusion maps were calculated using RAPID software. CONTRAST:  180mL ISOVUE-370 IOPAMIDOL (ISOVUE-370) INJECTION 76% COMPARISON:  None. FINDINGS: CT HEAD FINDINGS Brain: No acute large territory infarct, intracranial hemorrhage, mass, midline shift, or extra-axial fluid collection is identified. 9 mm hypodensity in the ventral right thalamus has a chronic appearance on coronal and sagittal reformats. Scattered subcortical and periventricular white matter hypodensities are nonspecific but compatible with mild chronic small vessel ischemic disease. The ventricles and sulci are normal. Vascular: Calcified atherosclerosis at the skull base. Skull: No fracture. Nonspecific 2 cm lucency in the right frontal bone. Sinuses/Orbits: Visualized paranasal sinuses and mastoid air cells are clear. Visualized orbits are unremarkable. Other: None. ASPECTS [facility] Stroke Program Early CT Score) Not scored due to symptoms suggesting a posterior fossa infarct. CTA NECK FINDINGS Aortic arch: Standard 3 vessel aortic arch. Moderate aortic atherosclerosis. Widely patent arch vessel origins. Right carotid system: Patent with calcified and soft plaque at the carotid bifurcation resulting in 50% proximal ICA stenosis. Left carotid system: Patent with calcified plaque at  the carotid bifurcation resulting in 40% proximal ICA stenosis. Vertebral arteries: Both vertebral arteries are patent with the left being strongly dominant. There is scattered nonstenotic left vertebral artery plaque. The right vertebral artery is diffusely hypoplastic without significant focal stenosis. Skeleton: Lower cervical disc degeneration, severe at C6-7. Other neck: No mass or enlarged lymph nodes. Upper chest: Grossly clear lung apices allowing for motion artifact. Review of the MIP images confirms the above findings CTA HEAD FINDINGS Anterior circulation: The internal carotid arteries are patent from skull base to carotid termini with  siphon atherosclerosis bilaterally. There is mild-to-moderate focal proximal right supraclinoid stenosis (series 13, image 240). MCAs are patent without evidence of proximal branch occlusion or flow limiting proximal stenosis. The ACAs are patent without significant right A1 stenosis. The left A1 segment is diffusely small and irregular with superimposed severe focal narrowing including of its origin. No aneurysm or vascular malformation. Posterior circulation: The intracranial left vertebral artery is patent with mild atherosclerotic irregularity but no stenosis. The intracranial right vertebral artery is diminutive, particularly distal to the PICA origin with full patency to the vertebrobasilar junction difficult to confirm due to its small size resulting in poor visualization of the distal most right V4 segment. Patent PICA origins are identified bilaterally, with the left PICA appearing dominant. The right PICA is small and poorly visualized. The SCAs are small and poorly evaluated, particularly on the left. The basilar artery is patent with mild diffuse irregularity but no stenosis. There is likely a diminutive right posterior communicating artery. The PCAs are patent with branch vessel irregularity but no evidence of significant proximal stenosis. No aneurysm or vascular malformation. Venous sinuses: Patent. Anatomic variants: None. Delayed phase: No abnormal enhancement. Review of the MIP images confirms the above findings CT Brain Perfusion Findings: CBF (<30%) Volume: 33mL Perfusion (Tmax>6.0s) volume: 88mL, located in the anteromedial left frontal lobe (ACA territory) and right temporoparietal white matter Mismatch Volume: 61mL Infarction Location: None. Preserved cerebral blood flow and blood volume in the above-mentioned areas with delayed perfusion. IMPRESSION: 1. No intracranial hemorrhage or acute infarct evident on CT. 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 2. No emergent large vessel occlusion. 3. Strongly dominant left vertebral artery without stenosis. 4. Diffusely hypoplastic right vertebral artery, poorly visualized distal to the PICA origin. 5. Bilateral proximal ICA stenoses, 50% on the right and 40% on the left. 6. Severe left A1 stenoses with mildly delayed perfusion in the medial left frontal lobe but no evidence of core infarct. 7. Mild-to-moderate right supraclinoid ICA stenosis. 8.  Aortic Atherosclerosis (ICD10-I70.0). These results were called by telephone at the time of interpretation on 02/06/2018 at 2:55 pm to Dr. Marjean Donna , who verbally acknowledged these results. Electronically Signed   By: Logan Bores M.D.   On: 02/06/2018 15:29   Ct Angio Neck W And/or Wo Contrast  Result Date: 02/06/2018 CLINICAL DATA:  Diplopia and dizziness. Third nerve palsy. Concern for posterior circulation/brainstem infarct. EXAM: CT ANGIOGRAPHY HEAD AND NECK CT PERFUSION BRAIN TECHNIQUE: Multidetector CT imaging of the head and neck was performed using the standard protocol during bolus administration of intravenous contrast. Multiplanar CT image reconstructions and MIPs were obtained to evaluate the vascular anatomy. Carotid stenosis measurements (when applicable) are obtained utilizing NASCET criteria, using the distal internal carotid diameter as the denominator. Multiphase CT imaging of the brain was performed following IV bolus contrast injection. Subsequent parametric perfusion maps were calculated using RAPID  software. CONTRAST:  115mL ISOVUE-370 IOPAMIDOL (ISOVUE-370) INJECTION 76% COMPARISON:  None. FINDINGS: CT HEAD FINDINGS Brain: No acute large territory infarct, intracranial hemorrhage, mass, midline shift, or extra-axial fluid collection is identified. 9 mm hypodensity in the ventral right thalamus has a chronic appearance on coronal and sagittal reformats. Scattered subcortical and periventricular white matter hypodensities are  nonspecific but compatible with mild chronic small vessel ischemic disease. The ventricles and sulci are normal. Vascular: Calcified atherosclerosis at the skull base. Skull: No fracture. Nonspecific 2 cm lucency in the right frontal bone. Sinuses/Orbits: Visualized paranasal sinuses and mastoid air cells are clear. Visualized orbits are unremarkable. Other: None. ASPECTS [facility] Stroke Program Early CT Score) Not scored due to symptoms suggesting a posterior fossa infarct. CTA NECK FINDINGS Aortic arch: Standard 3 vessel aortic arch. Moderate aortic atherosclerosis. Widely patent arch vessel origins. Right carotid system: Patent with calcified and soft plaque at the carotid bifurcation resulting in 50% proximal ICA stenosis. Left carotid system: Patent with calcified plaque at the carotid bifurcation resulting in 40% proximal ICA stenosis. Vertebral arteries: Both vertebral arteries are patent with the left being strongly dominant. There is scattered nonstenotic left vertebral artery plaque. The right vertebral artery is diffusely hypoplastic without significant focal stenosis. Skeleton: Lower cervical disc degeneration, severe at C6-7. Other neck: No mass or enlarged lymph nodes. Upper chest: Grossly clear lung apices allowing for motion artifact. Review of the MIP images confirms the above findings CTA HEAD FINDINGS Anterior circulation: The internal carotid arteries are patent from skull base to carotid termini with siphon atherosclerosis bilaterally. There is mild-to-moderate focal proximal right supraclinoid stenosis (series 13, image 240). MCAs are patent without evidence of proximal branch occlusion or flow limiting proximal stenosis. The ACAs are patent without significant right A1 stenosis. The left A1 segment is diffusely small and irregular with superimposed severe focal narrowing including of its origin. No aneurysm or vascular malformation. Posterior circulation: The intracranial left vertebral artery  is patent with mild atherosclerotic irregularity but no stenosis. The intracranial right vertebral artery is diminutive, particularly distal to the PICA origin with full patency to the vertebrobasilar junction difficult to confirm due to its small size resulting in poor visualization of the distal most right V4 segment. Patent PICA origins are identified bilaterally, with the left PICA appearing dominant. The right PICA is small and poorly visualized. The SCAs are small and poorly evaluated, particularly on the left. The basilar artery is patent with mild diffuse irregularity but no stenosis. There is likely a diminutive right posterior communicating artery. The PCAs are patent with branch vessel irregularity but no evidence of significant proximal stenosis. No aneurysm or vascular malformation. Venous sinuses: Patent. Anatomic variants: None. Delayed phase: No abnormal enhancement. Review of the MIP images confirms the above findings CT Brain Perfusion Findings: CBF (<30%) Volume: 79mL Perfusion (Tmax>6.0s) volume: 32mL, located in the anteromedial left frontal lobe (ACA territory) and right temporoparietal white matter Mismatch Volume: 22mL Infarction Location: None. Preserved cerebral blood flow and blood volume in the above-mentioned areas with delayed perfusion. IMPRESSION: 1. No intracranial hemorrhage or acute infarct evident on CT. 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 2. No emergent large vessel occlusion. 3. Strongly dominant left vertebral artery without stenosis. 4. Diffusely hypoplastic right vertebral artery, poorly visualized distal to the PICA origin. 5. Bilateral proximal ICA stenoses, 50% on the right and 40% on the left. 6. Severe left A1 stenoses with mildly delayed perfusion in the  medial left frontal lobe but no evidence of core infarct. 7. Mild-to-moderate right supraclinoid ICA stenosis. 8.  Aortic Atherosclerosis (ICD10-I70.0).  These results were called by telephone at the time of interpretation on 02/06/2018 at 2:55 pm to Dr. Marjean Donna , who verbally acknowledged these results. Electronically Signed   By: Logan Bores M.D.   On: 02/06/2018 15:29   Ct Cerebral Perfusion W Contrast  Result Date: 02/06/2018 CLINICAL DATA:  Diplopia and dizziness. Third nerve palsy. Concern for posterior circulation/brainstem infarct. EXAM: CT ANGIOGRAPHY HEAD AND NECK CT PERFUSION BRAIN TECHNIQUE: Multidetector CT imaging of the head and neck was performed using the standard protocol during bolus administration of intravenous contrast. Multiplanar CT image reconstructions and MIPs were obtained to evaluate the vascular anatomy. Carotid stenosis measurements (when applicable) are obtained utilizing NASCET criteria, using the distal internal carotid diameter as the denominator. Multiphase CT imaging of the brain was performed following IV bolus contrast injection. Subsequent parametric perfusion maps were calculated using RAPID software. CONTRAST:  166mL ISOVUE-370 IOPAMIDOL (ISOVUE-370) INJECTION 76% COMPARISON:  None. FINDINGS: CT HEAD FINDINGS Brain: No acute large territory infarct, intracranial hemorrhage, mass, midline shift, or extra-axial fluid collection is identified. 9 mm hypodensity in the ventral right thalamus has a chronic appearance on coronal and sagittal reformats. Scattered subcortical and periventricular white matter hypodensities are nonspecific but compatible with mild chronic small vessel ischemic disease. The ventricles and sulci are normal. Vascular: Calcified atherosclerosis at the skull base. Skull: No fracture. Nonspecific 2 cm lucency in the right frontal bone. Sinuses/Orbits: Visualized paranasal sinuses and mastoid air cells are clear. Visualized orbits are unremarkable. Other: None. ASPECTS [facility] Stroke Program Early CT Score) Not scored due to symptoms suggesting a posterior fossa infarct. CTA NECK FINDINGS Aortic  arch: Standard 3 vessel aortic arch. Moderate aortic atherosclerosis. Widely patent arch vessel origins. Right carotid system: Patent with calcified and soft plaque at the carotid bifurcation resulting in 50% proximal ICA stenosis. Left carotid system: Patent with calcified plaque at the carotid bifurcation resulting in 40% proximal ICA stenosis. Vertebral arteries: Both vertebral arteries are patent with the left being strongly dominant. There is scattered nonstenotic left vertebral artery plaque. The right vertebral artery is diffusely hypoplastic without significant focal stenosis. Skeleton: Lower cervical disc degeneration, severe at C6-7. Other neck: No mass or enlarged lymph nodes. Upper chest: Grossly clear lung apices allowing for motion artifact. Review of the MIP images confirms the above findings CTA HEAD FINDINGS Anterior circulation: The internal carotid arteries are patent from skull base to carotid termini with siphon atherosclerosis bilaterally. There is mild-to-moderate focal proximal right supraclinoid stenosis (series 13, image 240). MCAs are patent without evidence of proximal branch occlusion or flow limiting proximal stenosis. The ACAs are patent without significant right A1 stenosis. The left A1 segment is diffusely small and irregular with superimposed severe focal narrowing including of its origin. No aneurysm or vascular malformation. Posterior circulation: The intracranial left vertebral artery is patent with mild atherosclerotic irregularity but no stenosis. The intracranial right vertebral artery is diminutive, particularly distal to the PICA origin with full patency to the vertebrobasilar junction difficult to confirm due to its small size resulting in poor visualization of the distal most right V4 segment. Patent PICA origins are identified bilaterally, with the left PICA appearing dominant. The right PICA is small and poorly visualized. The SCAs are small and poorly evaluated,  particularly on the left. The basilar artery is patent with mild diffuse irregularity but no stenosis. There is likely a  diminutive right posterior communicating artery. The PCAs are patent with branch vessel irregularity but no evidence of significant proximal stenosis. No aneurysm or vascular malformation. Venous sinuses: Patent. Anatomic variants: None. Delayed phase: No abnormal enhancement. Review of the MIP images confirms the above findings CT Brain Perfusion Findings: CBF (<30%) Volume: 13mL Perfusion (Tmax>6.0s) volume: 65mL, located in the anteromedial left frontal lobe (ACA territory) and right temporoparietal white matter Mismatch Volume: 66mL Infarction Location: None. Preserved cerebral blood flow and blood volume in the above-mentioned areas with delayed perfusion. IMPRESSION: 1. No intracranial hemorrhage or acute infarct evident on CT. 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 2. No emergent large vessel occlusion. 3. Strongly dominant left vertebral artery without stenosis. 4. Diffusely hypoplastic right vertebral artery, poorly visualized distal to the PICA origin. 5. Bilateral proximal ICA stenoses, 50% on the right and 40% on the left. 6. Severe left A1 stenoses with mildly delayed perfusion in the medial left frontal lobe but no evidence of core infarct. 7. Mild-to-moderate right supraclinoid ICA stenosis. 8.  Aortic Atherosclerosis (ICD10-I70.0). These results were called by telephone at the time of interpretation on 02/06/2018 at 2:55 pm to Dr. Marjean Donna , who verbally acknowledged these results. Electronically Signed   By: Logan Bores M.D.   On: 02/06/2018 15:29   Ct Head Code Stroke Wo Contrast  Result Date: 02/06/2018 CLINICAL DATA:  Diplopia and dizziness. Third nerve palsy. Concern for posterior circulation/brainstem infarct. EXAM: CT ANGIOGRAPHY HEAD AND NECK CT PERFUSION BRAIN TECHNIQUE: Multidetector CT imaging of  the head and neck was performed using the standard protocol during bolus administration of intravenous contrast. Multiplanar CT image reconstructions and MIPs were obtained to evaluate the vascular anatomy. Carotid stenosis measurements (when applicable) are obtained utilizing NASCET criteria, using the distal internal carotid diameter as the denominator. Multiphase CT imaging of the brain was performed following IV bolus contrast injection. Subsequent parametric perfusion maps were calculated using RAPID software. CONTRAST:  183mL ISOVUE-370 IOPAMIDOL (ISOVUE-370) INJECTION 76% COMPARISON:  None. FINDINGS: CT HEAD FINDINGS Brain: No acute large territory infarct, intracranial hemorrhage, mass, midline shift, or extra-axial fluid collection is identified. 9 mm hypodensity in the ventral right thalamus has a chronic appearance on coronal and sagittal reformats. Scattered subcortical and periventricular white matter hypodensities are nonspecific but compatible with mild chronic small vessel ischemic disease. The ventricles and sulci are normal. Vascular: Calcified atherosclerosis at the skull base. Skull: No fracture. Nonspecific 2 cm lucency in the right frontal bone. Sinuses/Orbits: Visualized paranasal sinuses and mastoid air cells are clear. Visualized orbits are unremarkable. Other: None. ASPECTS [facility] Stroke Program Early CT Score) Not scored due to symptoms suggesting a posterior fossa infarct. CTA NECK FINDINGS Aortic arch: Standard 3 vessel aortic arch. Moderate aortic atherosclerosis. Widely patent arch vessel origins. Right carotid system: Patent with calcified and soft plaque at the carotid bifurcation resulting in 50% proximal ICA stenosis. Left carotid system: Patent with calcified plaque at the carotid bifurcation resulting in 40% proximal ICA stenosis. Vertebral arteries: Both vertebral arteries are patent with the left being strongly dominant. There is scattered nonstenotic left vertebral artery  plaque. The right vertebral artery is diffusely hypoplastic without significant focal stenosis. Skeleton: Lower cervical disc degeneration, severe at C6-7. Other neck: No mass or enlarged lymph nodes. Upper chest: Grossly clear lung apices allowing for motion artifact. Review of the MIP images confirms the above findings CTA HEAD FINDINGS Anterior circulation: The internal carotid arteries are patent  from skull base to carotid termini with siphon atherosclerosis bilaterally. There is mild-to-moderate focal proximal right supraclinoid stenosis (series 13, image 240). MCAs are patent without evidence of proximal branch occlusion or flow limiting proximal stenosis. The ACAs are patent without significant right A1 stenosis. The left A1 segment is diffusely small and irregular with superimposed severe focal narrowing including of its origin. No aneurysm or vascular malformation. Posterior circulation: The intracranial left vertebral artery is patent with mild atherosclerotic irregularity but no stenosis. The intracranial right vertebral artery is diminutive, particularly distal to the PICA origin with full patency to the vertebrobasilar junction difficult to confirm due to its small size resulting in poor visualization of the distal most right V4 segment. Patent PICA origins are identified bilaterally, with the left PICA appearing dominant. The right PICA is small and poorly visualized. The SCAs are small and poorly evaluated, particularly on the left. The basilar artery is patent with mild diffuse irregularity but no stenosis. There is likely a diminutive right posterior communicating artery. The PCAs are patent with branch vessel irregularity but no evidence of significant proximal stenosis. No aneurysm or vascular malformation. Venous sinuses: Patent. Anatomic variants: None. Delayed phase: No abnormal enhancement. Review of the MIP images confirms the above findings CT Brain Perfusion Findings: CBF (<30%) Volume: 32mL  Perfusion (Tmax>6.0s) volume: 41mL, located in the anteromedial left frontal lobe (ACA territory) and right temporoparietal white matter Mismatch Volume: 89mL Infarction Location: None. Preserved cerebral blood flow and blood volume in the above-mentioned areas with delayed perfusion. IMPRESSION: 1. No intracranial hemorrhage or acute infarct evident on CT. 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 2. No emergent large vessel occlusion. 3. Strongly dominant left vertebral artery without stenosis. 4. Diffusely hypoplastic right vertebral artery, poorly visualized distal to the PICA origin. 5. Bilateral proximal ICA stenoses, 50% on the right and 40% on the left. 6. Severe left A1 stenoses with mildly delayed perfusion in the medial left frontal lobe but no evidence of core infarct. 7. Mild-to-moderate right supraclinoid ICA stenosis. 8.  Aortic Atherosclerosis (ICD10-I70.0). These results were called by telephone at the time of interpretation on 02/06/2018 at 2:55 pm to Dr. Marjean Donna , who verbally acknowledged these results. Electronically Signed   By: Logan Bores M.D.   On: 02/06/2018 15:29    ____________________________________________    Procedures   ____________________________________________   INITIAL IMPRESSION / ASSESSMENT AND PLAN / ED COURSE  As part of my medical decision making, I reviewed the following data within the electronic MEDICAL RECORD NUMBER   62 year old male presenting with above stated history of physical exam concerning for acute CVA. As such, stroke was initiated. Patient discussed with Dr. Lucia Gaskins neurologist who evaluated patient who advised no TPA at this time however does read to admission for further evaluation for a acute third nerve palsy with concern for CVA. Patient also discussed with findings Antony Haste radiologist's with above-statedincluding left A1 severe stenosis and mild-to-moderate ICA subclinoid  stenosis. Spoke with the patient along with Dr. Lucia Gaskins and advised admission. Joint decision was made between Dr. Lucia Gaskins patient his wife and myself to forego tPA given duration of symptoms, risk of bleeding including the fact that the patient has prostate cancer etc. ____________________________________________  FINAL CLINICAL IMPRESSION(S) / ED DIAGNOSES  Final diagnoses:  Cerebrovascular accident (CVA), unspecified mechanism (Alpha)     MEDICATIONS GIVEN DURING THIS VISIT:  Medications  alteplase (ACTIVASE) 1 mg/mL injection (  Not Given 02/06/18  1503)  labetalol (NORMODYNE,TRANDATE) injection 10 mg (10 mg Intravenous Given 02/06/18 1444)  iopamidol (ISOVUE-370) 76 % injection 100 mL (100 mLs Intravenous Contrast Given 02/06/18 1419)  aspirin chewable tablet 324 mg (324 mg Oral Given 02/06/18 1532)     ED Discharge Orders    None       Note:  This document was prepared using Dragon voice recognition software and may include unintentional dictation errors.    Gregor Hams, MD 02/06/18 [phone]

## 2018-02-06 NOTE — H&P (Signed)
[facility] NAME: Alex Banks    MR#:  [id number]  DATE OF BIRTH:  11-20-1955  DATE OF ADMISSION:  02/06/2018  PRIMARY CARE PHYSICIAN: Wenda Low, MD   REQUESTING/REFERRING PHYSICIAN: Dr. Owens Shark.  CHIEF COMPLAINT:   Chief Complaint  Patient presents with  . Diplopia   Diplopia. HISTORY OF PRESENT ILLNESS:  Rockney Grenz  is a 62 y.o. male with a known history of hypertension and prostate cancer.  He presented to ED with above chief complaints.  He started to have a dizziness and double vision at about 10 AM today.  He still has double vision in the ED but denies any weakness, numbness or tingling, no dysphagia or slurred speech.  His blood pressure was high at 194/114 on arrival.  Code stroke was called by the CT of the head did not show any acute stroke but Severe left A1 stenoses with mildly delayed perfusion in the medial left frontal lobe but no evidence of core infarct..  Telemetry neurologist Dr. Lucia Gaskins suggested no TPA treatment at this time but admission for further evaluation for acute 3rd nerve palsy with concerns for CVA. PAST MEDICAL HISTORY:   Past Medical History:  Diagnosis Date  . Hypertension   . Prostate cancer ([place])     PAST SURGICAL HISTORY:   Past Surgical History:  Procedure Laterality Date  . PROSTATE BIOPSY      SOCIAL HISTORY:   Social History   Tobacco Use  . Smoking status: Light Tobacco Smoker    Packs/day: 0.10    Years: 44.00    Pack years: 4.40    Types: Cigarettes  . Smokeless tobacco: Never Used  Substance Use Topics  . Alcohol use: No    FAMILY HISTORY:   Family History  Problem Relation Age of Onset  . Cancer Mother        liver  . Cancer Brother        bone    DRUG ALLERGIES:  No Known Allergies  REVIEW OF SYSTEMS:   Review of Systems  Constitutional: Negative for chills, fever and malaise/fatigue.  HENT: Negative for sore throat.   Eyes: Positive for  blurred vision and double vision.  Respiratory: Negative for cough, hemoptysis, shortness of breath, wheezing and stridor.   Cardiovascular: Negative for chest pain, palpitations, orthopnea and leg swelling.  Gastrointestinal: Negative for abdominal pain, blood in stool, diarrhea, melena, nausea and vomiting.  Genitourinary: Negative for dysuria, flank pain and hematuria.  Musculoskeletal: Negative for back pain and joint pain.  Skin: Negative for rash.  Neurological: Negative for dizziness, tingling, tremors, sensory change, focal weakness, seizures, loss of consciousness, weakness and headaches.  Endo/Heme/Allergies: Negative for polydipsia.  Psychiatric/Behavioral: Negative for depression. The patient is nervous/anxious.     MEDICATIONS AT HOME:   Prior to Admission medications   Medication Sig Start Date End Date Taking? Authorizing Provider  amLODipine (NORVASC) 10 MG tablet Take 10 mg by mouth daily.   Yes [provider]  aspirin EC 81 MG tablet Take 81 mg by mouth daily.   Yes [provider]  atorvastatin (LIPITOR) 20 MG tablet Take 20 mg by mouth daily.   Yes [provider]  lisinopril-hydrochlorothiazide (PRINZIDE,ZESTORETIC) 20-25 MG tablet Take 1 tablet by mouth daily.   Yes [provider]      VITAL SIGNS:  Blood pressure 138/90, pulse 75, temperature 98.2 F (36.8 C), resp. rate 18, height 5\' 10"  (1.778 m), weight  187 lb (84.8 kg), SpO2 98 %.  PHYSICAL EXAMINATION:  Physical Exam  GENERAL:  62 y.o.-year-old patient lying in the bed with no acute distress.  EYES: Pupils equal, round, reactive to light and accommodation. No scleral icterus. Extraocular muscles intact.  HEENT: Head atraumatic, normocephalic. Oropharynx and nasopharynx clear.  NECK:  Supple, no jugular venous distention. No thyroid enlargement, no tenderness.  LUNGS: Normal breath sounds bilaterally, no wheezing, rales,rhonchi or crepitation. No use of accessory  muscles of respiration.  CARDIOVASCULAR: S1, S2 normal. No murmurs, rubs, or gallops.  ABDOMEN: Soft, nontender, nondistended. Bowel sounds present. No organomegaly or mass.  EXTREMITIES: No pedal edema, cyanosis, or clubbing.  NEUROLOGIC: Cranial nerves II through XII are intact except disconjugate gaze right eye lateral gaze position while left eye looking forward. Muscle strength 5/5 in all extremities. Sensation intact. Gait not checked.  PSYCHIATRIC: The patient is alert and oriented x 3.  SKIN: No obvious rash, lesion, or ulcer.   LABORATORY PANEL:   CBC Recent Labs  Lab 02/06/18 1411  WBC 12.8*  HGB 17.2  HCT 49.7  PLT 217   ------------------------------------------------------------------------------------------------------------------  Chemistries  Recent Labs  Lab 02/06/18 1411  NA 135  K 3.2*  CL 99*  CO2 26  GLUCOSE 119*  BUN 15  CREATININE 0.87  CALCIUM 9.6  AST 22  ALT 24  ALKPHOS 69  BILITOT 0.8   ------------------------------------------------------------------------------------------------------------------  Cardiac Enzymes Recent Labs  Lab 02/06/18 1411  TROPONINI <0.03   ------------------------------------------------------------------------------------------------------------------  RADIOLOGY:  Ct Angio Head W Or Wo Contrast  Result Date: 02/06/2018 CLINICAL DATA:  Diplopia and dizziness. Third nerve palsy. Concern for posterior circulation/brainstem infarct. EXAM: CT ANGIOGRAPHY HEAD AND NECK CT PERFUSION BRAIN TECHNIQUE: Multidetector CT imaging of the head and neck was performed using the standard protocol during bolus administration of intravenous contrast. Multiplanar CT image reconstructions and MIPs were obtained to evaluate the vascular anatomy. Carotid stenosis measurements (when applicable) are obtained utilizing NASCET criteria, using the distal internal carotid diameter as the denominator. Multiphase CT imaging of the brain was  performed following IV bolus contrast injection. Subsequent parametric perfusion maps were calculated using RAPID software. CONTRAST:  129mL ISOVUE-370 IOPAMIDOL (ISOVUE-370) INJECTION 76% COMPARISON:  None. FINDINGS: CT HEAD FINDINGS Brain: No acute large territory infarct, intracranial hemorrhage, mass, midline shift, or extra-axial fluid collection is identified. 9 mm hypodensity in the ventral right thalamus has a chronic appearance on coronal and sagittal reformats. Scattered subcortical and periventricular white matter hypodensities are nonspecific but compatible with mild chronic small vessel ischemic disease. The ventricles and sulci are normal. Vascular: Calcified atherosclerosis at the skull base. Skull: No fracture. Nonspecific 2 cm lucency in the right frontal bone. Sinuses/Orbits: Visualized paranasal sinuses and mastoid air cells are clear. Visualized orbits are unremarkable. Other: None. ASPECTS [facility] Stroke Program Early CT Score) Not scored due to symptoms suggesting a posterior fossa infarct. CTA NECK FINDINGS Aortic arch: Standard 3 vessel aortic arch. Moderate aortic atherosclerosis. Widely patent arch vessel origins. Right carotid system: Patent with calcified and soft plaque at the carotid bifurcation resulting in 50% proximal ICA stenosis. Left carotid system: Patent with calcified plaque at the carotid bifurcation resulting in 40% proximal ICA stenosis. Vertebral arteries: Both vertebral arteries are patent with the left being strongly dominant. There is scattered nonstenotic left vertebral artery plaque. The right vertebral artery is diffusely hypoplastic without significant focal stenosis. Skeleton: Lower cervical disc degeneration, severe at C6-7. Other neck: No mass or enlarged lymph nodes. Upper chest: Grossly clear  lung apices allowing for motion artifact. Review of the MIP images confirms the above findings CTA HEAD FINDINGS Anterior circulation: The internal carotid arteries are  patent from skull base to carotid termini with siphon atherosclerosis bilaterally. There is mild-to-moderate focal proximal right supraclinoid stenosis (series 13, image 240). MCAs are patent without evidence of proximal branch occlusion or flow limiting proximal stenosis. The ACAs are patent without significant right A1 stenosis. The left A1 segment is diffusely small and irregular with superimposed severe focal narrowing including of its origin. No aneurysm or vascular malformation. Posterior circulation: The intracranial left vertebral artery is patent with mild atherosclerotic irregularity but no stenosis. The intracranial right vertebral artery is diminutive, particularly distal to the PICA origin with full patency to the vertebrobasilar junction difficult to confirm due to its small size resulting in poor visualization of the distal most right V4 segment. Patent PICA origins are identified bilaterally, with the left PICA appearing dominant. The right PICA is small and poorly visualized. The SCAs are small and poorly evaluated, particularly on the left. The basilar artery is patent with mild diffuse irregularity but no stenosis. There is likely a diminutive right posterior communicating artery. The PCAs are patent with branch vessel irregularity but no evidence of significant proximal stenosis. No aneurysm or vascular malformation. Venous sinuses: Patent. Anatomic variants: None. Delayed phase: No abnormal enhancement. Review of the MIP images confirms the above findings CT Brain Perfusion Findings: CBF (<30%) Volume: 72mL Perfusion (Tmax>6.0s) volume: 49mL, located in the anteromedial left frontal lobe (ACA territory) and right temporoparietal white matter Mismatch Volume: 101mL Infarction Location: None. Preserved cerebral blood flow and blood volume in the above-mentioned areas with delayed perfusion. IMPRESSION: 1. No intracranial hemorrhage or acute infarct evident on CT. Mild chronic small vessel ischemic  disease and a likely chronic lacunar infarct in the right thalamus. 2. No emergent large vessel occlusion. 3. Strongly dominant left vertebral artery without stenosis. 4. Diffusely hypoplastic right vertebral artery, poorly visualized distal to the PICA origin. 5. Bilateral proximal ICA stenoses, 50% on the right and 40% on the left. 6. Severe left A1 stenoses with mildly delayed perfusion in the medial left frontal lobe but no evidence of core infarct. 7. Mild-to-moderate right supraclinoid ICA stenosis. 8.  Aortic Atherosclerosis (ICD10-I70.0). These results were called by telephone at the time of interpretation on 02/06/2018 at 2:55 pm to Dr. Marjean Donna , who verbally acknowledged these results. Electronically Signed   By: Logan Bores M.D.   On: 02/06/2018 15:29   Ct Angio Neck W And/or Wo Contrast  Result Date: 02/06/2018 CLINICAL DATA:  Diplopia and dizziness. Third nerve palsy. Concern for posterior circulation/brainstem infarct. EXAM: CT ANGIOGRAPHY HEAD AND NECK CT PERFUSION BRAIN TECHNIQUE: Multidetector CT imaging of the head and neck was performed using the standard protocol during bolus administration of intravenous contrast. Multiplanar CT image reconstructions and MIPs were obtained to evaluate the vascular anatomy. Carotid stenosis measurements (when applicable) are obtained utilizing NASCET criteria, using the distal internal carotid diameter as the denominator. Multiphase CT imaging of the brain was performed following IV bolus contrast injection. Subsequent parametric perfusion maps were calculated using RAPID software. CONTRAST:  155mL ISOVUE-370 IOPAMIDOL (ISOVUE-370) INJECTION 76% COMPARISON:  None. FINDINGS: CT HEAD FINDINGS Brain: No acute large territory infarct, intracranial hemorrhage, mass, midline shift, or extra-axial fluid collection is identified. 9 mm hypodensity in the ventral right thalamus has a chronic appearance on coronal and sagittal reformats. Scattered subcortical and  periventricular white matter hypodensities are nonspecific but  compatible with mild chronic small vessel ischemic disease. The ventricles and sulci are normal. Vascular: Calcified atherosclerosis at the skull base. Skull: No fracture. Nonspecific 2 cm lucency in the right frontal bone. Sinuses/Orbits: Visualized paranasal sinuses and mastoid air cells are clear. Visualized orbits are unremarkable. Other: None. ASPECTS [facility] Stroke Program Early CT Score) Not scored due to symptoms suggesting a posterior fossa infarct. CTA NECK FINDINGS Aortic arch: Standard 3 vessel aortic arch. Moderate aortic atherosclerosis. Widely patent arch vessel origins. Right carotid system: Patent with calcified and soft plaque at the carotid bifurcation resulting in 50% proximal ICA stenosis. Left carotid system: Patent with calcified plaque at the carotid bifurcation resulting in 40% proximal ICA stenosis. Vertebral arteries: Both vertebral arteries are patent with the left being strongly dominant. There is scattered nonstenotic left vertebral artery plaque. The right vertebral artery is diffusely hypoplastic without significant focal stenosis. Skeleton: Lower cervical disc degeneration, severe at C6-7. Other neck: No mass or enlarged lymph nodes. Upper chest: Grossly clear lung apices allowing for motion artifact. Review of the MIP images confirms the above findings CTA HEAD FINDINGS Anterior circulation: The internal carotid arteries are patent from skull base to carotid termini with siphon atherosclerosis bilaterally. There is mild-to-moderate focal proximal right supraclinoid stenosis (series 13, image 240). MCAs are patent without evidence of proximal branch occlusion or flow limiting proximal stenosis. The ACAs are patent without significant right A1 stenosis. The left A1 segment is diffusely small and irregular with superimposed severe focal narrowing including of its origin. No aneurysm or vascular malformation. Posterior  circulation: The intracranial left vertebral artery is patent with mild atherosclerotic irregularity but no stenosis. The intracranial right vertebral artery is diminutive, particularly distal to the PICA origin with full patency to the vertebrobasilar junction difficult to confirm due to its small size resulting in poor visualization of the distal most right V4 segment. Patent PICA origins are identified bilaterally, with the left PICA appearing dominant. The right PICA is small and poorly visualized. The SCAs are small and poorly evaluated, particularly on the left. The basilar artery is patent with mild diffuse irregularity but no stenosis. There is likely a diminutive right posterior communicating artery. The PCAs are patent with branch vessel irregularity but no evidence of significant proximal stenosis. No aneurysm or vascular malformation. Venous sinuses: Patent. Anatomic variants: None. Delayed phase: No abnormal enhancement. Review of the MIP images confirms the above findings CT Brain Perfusion Findings: CBF (<30%) Volume: 62mL Perfusion (Tmax>6.0s) volume: 58mL, located in the anteromedial left frontal lobe (ACA territory) and right temporoparietal white matter Mismatch Volume: 74mL Infarction Location: None. Preserved cerebral blood flow and blood volume in the above-mentioned areas with delayed perfusion. IMPRESSION: 1. No intracranial hemorrhage or acute infarct evident on CT. 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 2. No emergent large vessel occlusion. 3. Strongly dominant left vertebral artery without stenosis. 4. Diffusely hypoplastic right vertebral artery, poorly visualized distal to the PICA origin. 5. Bilateral proximal ICA stenoses, 50% on the right and 40% on the left. 6. Severe left A1 stenoses with mildly delayed perfusion in the medial left frontal lobe but no evidence of core infarct. 7. Mild-to-moderate right supraclinoid ICA  stenosis. 8.  Aortic Atherosclerosis (ICD10-I70.0). These results were called by telephone at the time of interpretation on 02/06/2018 at 2:55 pm to Dr. Marjean Donna , who verbally acknowledged these results. Electronically Signed   By: Logan Bores M.D.   On: 02/06/2018  15:29   Ct Cerebral Perfusion W Contrast  Result Date: 02/06/2018 CLINICAL DATA:  Diplopia and dizziness. Third nerve palsy. Concern for posterior circulation/brainstem infarct. EXAM: CT ANGIOGRAPHY HEAD AND NECK CT PERFUSION BRAIN TECHNIQUE: Multidetector CT imaging of the head and neck was performed using the standard protocol during bolus administration of intravenous contrast. Multiplanar CT image reconstructions and MIPs were obtained to evaluate the vascular anatomy. Carotid stenosis measurements (when applicable) are obtained utilizing NASCET criteria, using the distal internal carotid diameter as the denominator. Multiphase CT imaging of the brain was performed following IV bolus contrast injection. Subsequent parametric perfusion maps were calculated using RAPID software. CONTRAST:  177mL ISOVUE-370 IOPAMIDOL (ISOVUE-370) INJECTION 76% COMPARISON:  None. FINDINGS: CT HEAD FINDINGS Brain: No acute large territory infarct, intracranial hemorrhage, mass, midline shift, or extra-axial fluid collection is identified. 9 mm hypodensity in the ventral right thalamus has a chronic appearance on coronal and sagittal reformats. Scattered subcortical and periventricular white matter hypodensities are nonspecific but compatible with mild chronic small vessel ischemic disease. The ventricles and sulci are normal. Vascular: Calcified atherosclerosis at the skull base. Skull: No fracture. Nonspecific 2 cm lucency in the right frontal bone. Sinuses/Orbits: Visualized paranasal sinuses and mastoid air cells are clear. Visualized orbits are unremarkable. Other: None. ASPECTS [facility] Stroke Program Early CT Score) Not scored due to symptoms suggesting a  posterior fossa infarct. CTA NECK FINDINGS Aortic arch: Standard 3 vessel aortic arch. Moderate aortic atherosclerosis. Widely patent arch vessel origins. Right carotid system: Patent with calcified and soft plaque at the carotid bifurcation resulting in 50% proximal ICA stenosis. Left carotid system: Patent with calcified plaque at the carotid bifurcation resulting in 40% proximal ICA stenosis. Vertebral arteries: Both vertebral arteries are patent with the left being strongly dominant. There is scattered nonstenotic left vertebral artery plaque. The right vertebral artery is diffusely hypoplastic without significant focal stenosis. Skeleton: Lower cervical disc degeneration, severe at C6-7. Other neck: No mass or enlarged lymph nodes. Upper chest: Grossly clear lung apices allowing for motion artifact. Review of the MIP images confirms the above findings CTA HEAD FINDINGS Anterior circulation: The internal carotid arteries are patent from skull base to carotid termini with siphon atherosclerosis bilaterally. There is mild-to-moderate focal proximal right supraclinoid stenosis (series 13, image 240). MCAs are patent without evidence of proximal branch occlusion or flow limiting proximal stenosis. The ACAs are patent without significant right A1 stenosis. The left A1 segment is diffusely small and irregular with superimposed severe focal narrowing including of its origin. No aneurysm or vascular malformation. Posterior circulation: The intracranial left vertebral artery is patent with mild atherosclerotic irregularity but no stenosis. The intracranial right vertebral artery is diminutive, particularly distal to the PICA origin with full patency to the vertebrobasilar junction difficult to confirm due to its small size resulting in poor visualization of the distal most right V4 segment. Patent PICA origins are identified bilaterally, with the left PICA appearing dominant. The right PICA is small and poorly  visualized. The SCAs are small and poorly evaluated, particularly on the left. The basilar artery is patent with mild diffuse irregularity but no stenosis. There is likely a diminutive right posterior communicating artery. The PCAs are patent with branch vessel irregularity but no evidence of significant proximal stenosis. No aneurysm or vascular malformation. Venous sinuses: Patent. Anatomic variants: None. Delayed phase: No abnormal enhancement. Review of the MIP images confirms the above findings CT Brain Perfusion Findings: CBF (<30%) Volume: 74mL Perfusion (Tmax>6.0s) volume: 48mL, located in the anteromedial  left frontal lobe (ACA territory) and right temporoparietal white matter Mismatch Volume: 28mL Infarction Location: None. Preserved cerebral blood flow and blood volume in the above-mentioned areas with delayed perfusion. IMPRESSION: 1. No intracranial hemorrhage or acute infarct evident on CT. 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 2. No emergent large vessel occlusion. 3. Strongly dominant left vertebral artery without stenosis. 4. Diffusely hypoplastic right vertebral artery, poorly visualized distal to the PICA origin. 5. Bilateral proximal ICA stenoses, 50% on the right and 40% on the left. 6. Severe left A1 stenoses with mildly delayed perfusion in the medial left frontal lobe but no evidence of core infarct. 7. Mild-to-moderate right supraclinoid ICA stenosis. 8.  Aortic Atherosclerosis (ICD10-I70.0). These results were called by telephone at the time of interpretation on 02/06/2018 at 2:55 pm to Dr. Marjean Donna , who verbally acknowledged these results. Electronically Signed   By: Logan Bores M.D.   On: 02/06/2018 15:29   Ct Head Code Stroke Wo Contrast  Result Date: 02/06/2018 CLINICAL DATA:  Diplopia and dizziness. Third nerve palsy. Concern for posterior circulation/brainstem infarct. EXAM: CT ANGIOGRAPHY HEAD AND NECK CT  PERFUSION BRAIN TECHNIQUE: Multidetector CT imaging of the head and neck was performed using the standard protocol during bolus administration of intravenous contrast. Multiplanar CT image reconstructions and MIPs were obtained to evaluate the vascular anatomy. Carotid stenosis measurements (when applicable) are obtained utilizing NASCET criteria, using the distal internal carotid diameter as the denominator. Multiphase CT imaging of the brain was performed following IV bolus contrast injection. Subsequent parametric perfusion maps were calculated using RAPID software. CONTRAST:  150mL ISOVUE-370 IOPAMIDOL (ISOVUE-370) INJECTION 76% COMPARISON:  None. FINDINGS: CT HEAD FINDINGS Brain: No acute large territory infarct, intracranial hemorrhage, mass, midline shift, or extra-axial fluid collection is identified. 9 mm hypodensity in the ventral right thalamus has a chronic appearance on coronal and sagittal reformats. Scattered subcortical and periventricular white matter hypodensities are nonspecific but compatible with mild chronic small vessel ischemic disease. The ventricles and sulci are normal. Vascular: Calcified atherosclerosis at the skull base. Skull: No fracture. Nonspecific 2 cm lucency in the right frontal bone. Sinuses/Orbits: Visualized paranasal sinuses and mastoid air cells are clear. Visualized orbits are unremarkable. Other: None. ASPECTS [facility] Stroke Program Early CT Score) Not scored due to symptoms suggesting a posterior fossa infarct. CTA NECK FINDINGS Aortic arch: Standard 3 vessel aortic arch. Moderate aortic atherosclerosis. Widely patent arch vessel origins. Right carotid system: Patent with calcified and soft plaque at the carotid bifurcation resulting in 50% proximal ICA stenosis. Left carotid system: Patent with calcified plaque at the carotid bifurcation resulting in 40% proximal ICA stenosis. Vertebral arteries: Both vertebral arteries are patent with the left being strongly dominant.  There is scattered nonstenotic left vertebral artery plaque. The right vertebral artery is diffusely hypoplastic without significant focal stenosis. Skeleton: Lower cervical disc degeneration, severe at C6-7. Other neck: No mass or enlarged lymph nodes. Upper chest: Grossly clear lung apices allowing for motion artifact. Review of the MIP images confirms the above findings CTA HEAD FINDINGS Anterior circulation: The internal carotid arteries are patent from skull base to carotid termini with siphon atherosclerosis bilaterally. There is mild-to-moderate focal proximal right supraclinoid stenosis (series 13, image 240). MCAs are patent without evidence of proximal branch occlusion or flow limiting proximal stenosis. The ACAs are patent without significant right A1 stenosis. The left A1 segment is diffusely small and irregular with superimposed severe focal narrowing including of  its origin. No aneurysm or vascular malformation. Posterior circulation: The intracranial left vertebral artery is patent with mild atherosclerotic irregularity but no stenosis. The intracranial right vertebral artery is diminutive, particularly distal to the PICA origin with full patency to the vertebrobasilar junction difficult to confirm due to its small size resulting in poor visualization of the distal most right V4 segment. Patent PICA origins are identified bilaterally, with the left PICA appearing dominant. The right PICA is small and poorly visualized. The SCAs are small and poorly evaluated, particularly on the left. The basilar artery is patent with mild diffuse irregularity but no stenosis. There is likely a diminutive right posterior communicating artery. The PCAs are patent with branch vessel irregularity but no evidence of significant proximal stenosis. No aneurysm or vascular malformation. Venous sinuses: Patent. Anatomic variants: None. Delayed phase: No abnormal enhancement. Review of the MIP images confirms the above  findings CT Brain Perfusion Findings: CBF (<30%) Volume: 39mL Perfusion (Tmax>6.0s) volume: 60mL, located in the anteromedial left frontal lobe (ACA territory) and right temporoparietal white matter Mismatch Volume: 28mL Infarction Location: None. Preserved cerebral blood flow and blood volume in the above-mentioned areas with delayed perfusion. IMPRESSION: 1. No intracranial hemorrhage or acute infarct evident on CT. 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 2. No emergent large vessel occlusion. 3. Strongly dominant left vertebral artery without stenosis. 4. Diffusely hypoplastic right vertebral artery, poorly visualized distal to the PICA origin. 5. Bilateral proximal ICA stenoses, 50% on the right and 40% on the left. 6. Severe left A1 stenoses with mildly delayed perfusion in the medial left frontal lobe but no evidence of core infarct. 7. Mild-to-moderate right supraclinoid ICA stenosis. 8.  Aortic Atherosclerosis (ICD10-I70.0). These results were called by telephone at the time of interpretation on 02/06/2018 at 2:55 pm to Dr. Marjean Donna , who verbally acknowledged these results. Electronically Signed   By: Logan Bores M.D.   On: 02/06/2018 15:29      IMPRESSION AND PLAN:   Acute CVA. The patient will be admitted to medical floor. Start aspirin and Lipitor, neurochecks, follow-up MRI/MRA of the brain, echocardiogram and carotid duplex.  Neurology consult.  Hypertension emergency. Continue amlodipine, lisinopril-HCTZ, allow permissive blood pressure due to acute stroke.  Hypokalemia.  Give potassium supplement and follow-up level.  History of prostate cancer.  Follow-up oncologist as outpatient.  Tobacco abuse.  Smoking cessation was counseled for 4 minutes, nicotine patch.  All the records are reviewed and case discussed with ED provider. Management plans discussed with the patient, his wife and they are in agreement.  CODE  STATUS: Full code  TOTAL TIME TAKING CARE OF THIS PATIENT: 53 minutes.    Demetrios Loll M.D on 02/06/2018 at 4:14 PM  Between 7am to 6pm - Pager - [phone]  After 6pm go to [email or web address] - Proofreader  [facility]  Office  [phone]  CC: Primary care physician; Wenda Low, MD   Note: This dictation was prepared with Dragon dictation along with smaller phrase technology. Any transcriptional errors that result from this process are unin

## 2018-02-06 NOTE — ED Notes (Signed)
CODE STROKE CALLED TO [id number] 

## 2018-02-07 ENCOUNTER — Inpatient Hospital Stay: Payer: BLUE CROSS/BLUE SHIELD

## 2018-02-07 ENCOUNTER — Inpatient Hospital Stay
Admit: 2018-02-07 | Discharge: 2018-02-07 | Disposition: A | Discharge status: Home / Self Care | Payer: BLUE CROSS/BLUE SHIELD | Attending: Internal Medicine | Admitting: Internal Medicine

## 2018-02-07 DIAGNOSIS — G459 Transient cerebral ischemic attack, unspecified: Principal | ICD-10-CM

## 2018-02-07 LAB — LIPID PANEL
Cholesterol: 181 mg/dL (ref 0–200)
HDL: 39 mg/dL — AB (ref >40)
LDL CALC: 112 mg/dL — AB (ref 0–99)
TRIGLYCERIDES: 149 mg/dL (ref <150)
Total CHOL/HDL Ratio: 4.6 RATIO
VLDL: 30 mg/dL (ref 0–40)

## 2018-02-07 LAB — POTASSIUM: Potassium: 3.3 mmol/L — ABNORMAL LOW (ref 3.5–5.1)

## 2018-02-07 LAB — HEMOGLOBIN A1C
HEMOGLOBIN A1C: 5.5 % (ref 4.8–5.6)
Mean Plasma Glucose: 111.15 mg/dL

## 2018-02-07 LAB — TSH: TSH: 1.614 u[IU]/mL (ref 0.350–4.500)

## 2018-02-07 MED ORDER — METOPROLOL TARTRATE 25 MG PO TABS
25.0000 mg | ORAL_TABLET | Freq: Two times a day (BID) | ORAL | Status: DC
  Administered 2018-02-07 – 2018-02-08 (×3): 25 mg via ORAL
  Filled 2018-02-07 (×4): qty 1

## 2018-02-07 MED ORDER — AMIODARONE IV BOLUS ONLY 150 MG/100ML
150.0000 mg | Freq: Once | INTRAVENOUS | Status: AC
  Administered 2018-02-07: 150 mg via INTRAVENOUS
  Filled 2018-02-07: qty 100

## 2018-02-07 MED ORDER — CLOPIDOGREL BISULFATE 75 MG PO TABS
75.0000 mg | ORAL_TABLET | Freq: Every day | ORAL | Status: DC
  Administered 2018-02-07 – 2018-02-08 (×2): 75 mg via ORAL
  Filled 2018-02-07 (×2): qty 1

## 2018-02-07 MED ORDER — NICOTINE 14 MG/24HR TD PT24
14.0000 mg | MEDICATED_PATCH | Freq: Every day | TRANSDERMAL | Status: DC
  Administered 2018-02-07 – 2018-02-08 (×2): 14 mg via TRANSDERMAL
  Filled 2018-02-07 (×2): qty 1

## 2018-02-07 MED ORDER — ASPIRIN EC 81 MG PO TBEC
81.0000 mg | DELAYED_RELEASE_TABLET | Freq: Every day | ORAL | Status: DC
  Administered 2018-02-08: 81 mg via ORAL
  Filled 2018-02-07: qty 1

## 2018-02-07 MED ORDER — PERFLUTREN LIPID MICROSPHERE
1.0000 mL | INTRAVENOUS | Status: AC | PRN
  Administered 2018-02-07: 1 mL via INTRAVENOUS
  Filled 2018-02-07: qty 2

## 2018-02-07 MED ORDER — POTASSIUM CHLORIDE CRYS ER 20 MEQ PO TBCR
40.0000 meq | EXTENDED_RELEASE_TABLET | Freq: Once | ORAL | Status: AC
  Administered 2018-02-07: 10:00:00 40 meq via ORAL
  Filled 2018-02-07: qty 2

## 2018-02-07 NOTE — Plan of Care (Signed)
  Problem: Activity: Goal: Risk for activity intolerance will decrease Outcome: Progressing   Problem: Safety: Goal: Ability to remain free from injury will improve Outcome: Progressing   Problem: Education: Goal: Knowledge of secondary prevention will improve Outcome: Progressing   Problem: Ischemic Stroke/TIA Tissue Perfusion: Goal: Complications of ischemic stroke/TIA will be minimized Outcome: Progressing

## 2018-02-07 NOTE — Progress Notes (Addendum)
[facility] [place] NAME: Alex Banks    MR#:  [id number]  DATE OF BIRTH:  03/11/1956  SUBJECTIVE:  CHIEF COMPLAINT: Patient is blurry vision is resolved, denies any headache Patient went into nonsustained V. tach 15 beats.  Denies any chest pain or palpitations or dizziness  REVIEW OF SYSTEMS:  CONSTITUTIONAL: No fever, fatigue or weakness.  EYES: No blurred or double vision.  EARS, NOSE, AND THROAT: No tinnitus or ear pain.  RESPIRATORY: No cough, shortness of breath, wheezing or hemoptysis.  CARDIOVASCULAR: No chest pain, orthopnea, edema.  GASTROINTESTINAL: No nausea, vomiting, diarrhea or abdominal pain.  GENITOURINARY: No dysuria, hematuria.  ENDOCRINE: No polyuria, nocturia,  HEMATOLOGY: No anemia, easy bruising or bleeding SKIN: No rash or lesion. MUSCULOSKELETAL: No joint pain or arthritis.   NEUROLOGIC: No tingling, numbness, weakness.  PSYCHIATRY: No anxiety or depression.   DRUG ALLERGIES:  No Known Allergies  VITALS:  Blood pressure (!) 157/78, pulse 81, temperature 97.9 F (36.6 C), temperature source Oral, resp. rate 18, height 5\' 10"  (1.778 m), weight 84.5 kg (186 lb 3.2 oz), SpO2 98 %.  PHYSICAL EXAMINATION:  GENERAL:  62 y.o.-year-old patient lying in the bed with no acute distress.  EYES: Pupils equal, round, reactive to light and accommodation. No scleral icterus. Extraocular muscles intact.  HEENT: Head atraumatic, normocephalic. Oropharynx and nasopharynx clear.  NECK:  Supple, no jugular venous distention. No thyroid enlargement, no tenderness.  LUNGS: Normal breath sounds bilaterally, no wheezing, rales,rhonchi or crepitation. No use of accessory muscles of respiration.  CARDIOVASCULAR: S1, S2 normal. No murmurs, rubs, or gallops.  ABDOMEN: Soft, nontender, nondistended. Bowel sounds present. No organomegaly or mass.  EXTREMITIES: No pedal edema, cyanosis, or clubbing.  NEUROLOGIC: Cranial nerves  II through XII are intact. Muscle strength 5/5 in all extremities. Sensation intact. Gait not checked.  PSYCHIATRIC: The patient is alert and oriented x 3.  SKIN: No obvious rash, lesion, or ulcer.    LABORATORY PANEL:   CBC Recent Labs  Lab 02/06/18 1411  WBC 12.8*  HGB 17.2  HCT 49.7  PLT 217   ------------------------------------------------------------------------------------------------------------------  Chemistries  Recent Labs  Lab 02/06/18 1411 02/07/18 0349  NA 135  --   K 3.2* 3.3*  CL 99*  --   CO2 26  --   GLUCOSE 119*  --   BUN 15  --   CREATININE 0.87  --   CALCIUM 9.6  --   MG 2.1  --   AST 22  --   ALT 24  --   ALKPHOS 69  --   BILITOT 0.8  --    ------------------------------------------------------------------------------------------------------------------  Cardiac Enzymes Recent Labs  Lab 02/06/18 1411  TROPONINI <0.03   ------------------------------------------------------------------------------------------------------------------  RADIOLOGY:  Ct Angio Head W Or Wo Contrast  Result Date: 02/06/2018 CLINICAL DATA:  Diplopia and dizziness. Third nerve palsy. Concern for posterior circulation/brainstem infarct. EXAM: CT ANGIOGRAPHY HEAD AND NECK CT PERFUSION BRAIN TECHNIQUE: Multidetector CT imaging of the head and neck was performed using the standard protocol during bolus administration of intravenous contrast. Multiplanar CT image reconstructions and MIPs were obtained to evaluate the vascular anatomy. Carotid stenosis measurements (when applicable) are obtained utilizing NASCET criteria, using the distal internal carotid diameter as the denominator. Multiphase CT imaging of the brain was performed following IV bolus contrast injection. Subsequent parametric perfusion maps were calculated using RAPID software. CONTRAST:  160mL ISOVUE-370 IOPAMIDOL (ISOVUE-370) INJECTION 76% COMPARISON:  None. FINDINGS: CT HEAD FINDINGS  Brain: No acute large  territory infarct, intracranial hemorrhage, mass, midline shift, or extra-axial fluid collection is identified. 9 mm hypodensity in the ventral right thalamus has a chronic appearance on coronal and sagittal reformats. Scattered subcortical and periventricular white matter hypodensities are nonspecific but compatible with mild chronic small vessel ischemic disease. The ventricles and sulci are normal. Vascular: Calcified atherosclerosis at the skull base. Skull: No fracture. Nonspecific 2 cm lucency in the right frontal bone. Sinuses/Orbits: Visualized paranasal sinuses and mastoid air cells are clear. Visualized orbits are unremarkable. Other: None. ASPECTS [facility] Stroke Program Early CT Score) Not scored due to symptoms suggesting a posterior fossa infarct. CTA NECK FINDINGS Aortic arch: Standard 3 vessel aortic arch. Moderate aortic atherosclerosis. Widely patent arch vessel origins. Right carotid system: Patent with calcified and soft plaque at the carotid bifurcation resulting in 50% proximal ICA stenosis. Left carotid system: Patent with calcified plaque at the carotid bifurcation resulting in 40% proximal ICA stenosis. Vertebral arteries: Both vertebral arteries are patent with the left being strongly dominant. There is scattered nonstenotic left vertebral artery plaque. The right vertebral artery is diffusely hypoplastic without significant focal stenosis. Skeleton: Lower cervical disc degeneration, severe at C6-7. Other neck: No mass or enlarged lymph nodes. Upper chest: Grossly clear lung apices allowing for motion artifact. Review of the MIP images confirms the above findings CTA HEAD FINDINGS Anterior circulation: The internal carotid arteries are patent from skull base to carotid termini with siphon atherosclerosis bilaterally. There is mild-to-moderate focal proximal right supraclinoid stenosis (series 13, image 240). MCAs are patent without evidence of proximal branch occlusion or flow limiting  proximal stenosis. The ACAs are patent without significant right A1 stenosis. The left A1 segment is diffusely small and irregular with superimposed severe focal narrowing including of its origin. No aneurysm or vascular malformation. Posterior circulation: The intracranial left vertebral artery is patent with mild atherosclerotic irregularity but no stenosis. The intracranial right vertebral artery is diminutive, particularly distal to the PICA origin with full patency to the vertebrobasilar junction difficult to confirm due to its small size resulting in poor visualization of the distal most right V4 segment. Patent PICA origins are identified bilaterally, with the left PICA appearing dominant. The right PICA is small and poorly visualized. The SCAs are small and poorly evaluated, particularly on the left. The basilar artery is patent with mild diffuse irregularity but no stenosis. There is likely a diminutive right posterior communicating artery. The PCAs are patent with branch vessel irregularity but no evidence of significant proximal stenosis. No aneurysm or vascular malformation. Venous sinuses: Patent. Anatomic variants: None. Delayed phase: No abnormal enhancement. Review of the MIP images confirms the above findings CT Brain Perfusion Findings: CBF (<30%) Volume: 89mL Perfusion (Tmax>6.0s) volume: 76mL, located in the anteromedial left frontal lobe (ACA territory) and right temporoparietal white matter Mismatch Volume: 72mL Infarction Location: None. Preserved cerebral blood flow and blood volume in the above-mentioned areas with delayed perfusion. IMPRESSION: 1. No intracranial hemorrhage or acute infarct evident on CT. 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 2. No emergent large vessel occlusion. 3. Strongly dominant left vertebral artery without stenosis. 4. Diffusely hypoplastic right vertebral artery, poorly visualized distal to the PICA  origin. 5. Bilateral proximal ICA stenoses, 50% on the right and 40% on the left. 6. Severe left A1 stenoses with mildly delayed perfusion in the medial left frontal lobe but no evidence of core infarct. 7. Mild-to-moderate right supraclinoid ICA  stenosis. 8.  Aortic Atherosclerosis (ICD10-I70.0). These results were called by telephone at the time of interpretation on 02/06/2018 at 2:55 pm to Dr. Marjean Donna , who verbally acknowledged these results. Electronically Signed   By: Logan Bores M.D.   On: 02/06/2018 15:29   Ct Angio Neck W And/or Wo Contrast  Result Date: 02/06/2018 CLINICAL DATA:  Diplopia and dizziness. Third nerve palsy. Concern for posterior circulation/brainstem infarct. EXAM: CT ANGIOGRAPHY HEAD AND NECK CT PERFUSION BRAIN TECHNIQUE: Multidetector CT imaging of the head and neck was performed using the standard protocol during bolus administration of intravenous contrast. Multiplanar CT image reconstructions and MIPs were obtained to evaluate the vascular anatomy. Carotid stenosis measurements (when applicable) are obtained utilizing NASCET criteria, using the distal internal carotid diameter as the denominator. Multiphase CT imaging of the brain was performed following IV bolus contrast injection. Subsequent parametric perfusion maps were calculated using RAPID software. CONTRAST:  161mL ISOVUE-370 IOPAMIDOL (ISOVUE-370) INJECTION 76% COMPARISON:  None. FINDINGS: CT HEAD FINDINGS Brain: No acute large territory infarct, intracranial hemorrhage, mass, midline shift, or extra-axial fluid collection is identified. 9 mm hypodensity in the ventral right thalamus has a chronic appearance on coronal and sagittal reformats. Scattered subcortical and periventricular white matter hypodensities are nonspecific but compatible with mild chronic small vessel ischemic disease. The ventricles and sulci are normal. Vascular: Calcified atherosclerosis at the skull base. Skull: No fracture. Nonspecific 2 cm  lucency in the right frontal bone. Sinuses/Orbits: Visualized paranasal sinuses and mastoid air cells are clear. Visualized orbits are unremarkable. Other: None. ASPECTS [facility] Stroke Program Early CT Score) Not scored due to symptoms suggesting a posterior fossa infarct. CTA NECK FINDINGS Aortic arch: Standard 3 vessel aortic arch. Moderate aortic atherosclerosis. Widely patent arch vessel origins. Right carotid system: Patent with calcified and soft plaque at the carotid bifurcation resulting in 50% proximal ICA stenosis. Left carotid system: Patent with calcified plaque at the carotid bifurcation resulting in 40% proximal ICA stenosis. Vertebral arteries: Both vertebral arteries are patent with the left being strongly dominant. There is scattered nonstenotic left vertebral artery plaque. The right vertebral artery is diffusely hypoplastic without significant focal stenosis. Skeleton: Lower cervical disc degeneration, severe at C6-7. Other neck: No mass or enlarged lymph nodes. Upper chest: Grossly clear lung apices allowing for motion artifact. Review of the MIP images confirms the above findings CTA HEAD FINDINGS Anterior circulation: The internal carotid arteries are patent from skull base to carotid termini with siphon atherosclerosis bilaterally. There is mild-to-moderate focal proximal right supraclinoid stenosis (series 13, image 240). MCAs are patent without evidence of proximal branch occlusion or flow limiting proximal stenosis. The ACAs are patent without significant right A1 stenosis. The left A1 segment is diffusely small and irregular with superimposed severe focal narrowing including of its origin. No aneurysm or vascular malformation. Posterior circulation: The intracranial left vertebral artery is patent with mild atherosclerotic irregularity but no stenosis. The intracranial right vertebral artery is diminutive, particularly distal to the PICA origin with full patency to the vertebrobasilar  junction difficult to confirm due to its small size resulting in poor visualization of the distal most right V4 segment. Patent PICA origins are identified bilaterally, with the left PICA appearing dominant. The right PICA is small and poorly visualized. The SCAs are small and poorly evaluated, particularly on the left. The basilar artery is patent with mild diffuse irregularity but no stenosis. There is likely a diminutive right posterior communicating artery. The PCAs are patent with branch vessel irregularity but  no evidence of significant proximal stenosis. No aneurysm or vascular malformation. Venous sinuses: Patent. Anatomic variants: None. Delayed phase: No abnormal enhancement. Review of the MIP images confirms the above findings CT Brain Perfusion Findings: CBF (<30%) Volume: 49mL Perfusion (Tmax>6.0s) volume: 63mL, located in the anteromedial left frontal lobe (ACA territory) and right temporoparietal white matter Mismatch Volume: 75mL Infarction Location: None. Preserved cerebral blood flow and blood volume in the above-mentioned areas with delayed perfusion. IMPRESSION: 1. No intracranial hemorrhage or acute infarct evident on CT. 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 2. No emergent large vessel occlusion. 3. Strongly dominant left vertebral artery without stenosis. 4. Diffusely hypoplastic right vertebral artery, poorly visualized distal to the PICA origin. 5. Bilateral proximal ICA stenoses, 50% on the right and 40% on the left. 6. Severe left A1 stenoses with mildly delayed perfusion in the medial left frontal lobe but no evidence of core infarct. 7. Mild-to-moderate right supraclinoid ICA stenosis. 8.  Aortic Atherosclerosis (ICD10-I70.0). These results were called by telephone at the time of interpretation on 02/06/2018 at 2:55 pm to Dr. Marjean Donna , who verbally acknowledged these results. Electronically Signed   By: Logan Bores  M.D.   On: 02/06/2018 15:29   Mr Brain Wo Contrast  Result Date: 02/06/2018 CLINICAL DATA:  Dizziness and double vision. History of prostate cancer and hypertension. EXAM: MRI HEAD WITHOUT CONTRAST TECHNIQUE: Multiplanar, multiecho pulse sequences of the brain and surrounding structures were obtained without intravenous contrast. COMPARISON:  Head CT 02/06/2018 FINDINGS: BRAIN: The midline structures are normal. There is no acute infarct or acute hemorrhage. No mass lesion, hydrocephalus, dural abnormality or extra-axial collection. Early confluent hyperintense T2-weighted signal of the periventricular and deep white matter, most commonly due to chronic ischemic microangiopathy. Old right basal ganglia and right thalamus lacunar infarcts. No age-advanced or lobar predominant atrophy. No chronic microhemorrhage or superficial siderosis. VASCULAR: Major intracranial arterial and venous sinus flow voids are preserved. SKULL AND UPPER CERVICAL SPINE: The visualized skull base, calvarium, upper cervical spine and extracranial soft tissues are normal. SINUSES/ORBITS: No fluid levels or advanced mucosal thickening. No mastoid or middle ear effusion. Normal orbits. IMPRESSION: 1. No acute intracranial abnormality. 2. Chronic ischemic microangiopathy. Electronically Signed   By: Ulyses Jarred M.D.   On: 02/06/2018 22:18   US Carotid Bilateral (at [facility] And [facility] Only)  Result Date: 02/07/2018 CLINICAL DATA:  Stroke EXAM: BILATERAL CAROTID DUPLEX ULTRASOUND TECHNIQUE: Pearline Cables scale imaging, color Doppler and duplex ultrasound was performed of bilateral carotid and vertebral arteries in the neck. COMPARISON:  CTA 4 19 2019 TECHNIQUE: Quantification of carotid stenosis is based on velocity parameters that correlate the residual internal carotid diameter with NASCET-based stenosis levels, using the diameter of the distal internal carotid lumen as the denominator for stenosis measurement. The following velocity measurements were  obtained: PEAK SYSTOLIC/END DIASTOLIC RIGHT ICA:                     143/40cm/sec CCA:                     [id number] SYSTOLIC ICA/CCA RATIO:  1.9 ECA:                     158cm/sec LEFT ICA:                     102/32cm/sec CCA:  [id number] SYSTOLIC ICA/CCA RATIO:  1.1 ECA:                     184cm/sec FINDINGS: RIGHT CAROTID ARTERY: Scattered nonocclusive plaque through the common carotid artery. Heavier eccentric somewhat irregular plaque in the carotid bulb resulting in at least mild stenosis. Normal waveforms and color Doppler signal. RIGHT VERTEBRAL ARTERY:  Normal flow direction and waveform. LEFT CAROTID ARTERY: Scattered nonocclusive plaque through the common carotid artery and bulb. Eccentric plaque in the proximal ICA resulting in at least mild stenosis. Normal waveforms and color Doppler signal. A nonspecific cardiac arrhythmia noted. LEFT VERTEBRAL ARTERY: Normal flow direction and waveform. IMPRESSION: 1. Bilateral carotid bifurcation and proximal ICA plaque, resulting in less than 50% diameter stenosis on the left, 50-69% diameter stenosis on the right. 2.  Antegrade bilateral vertebral arterial flow. Electronically Signed   By: Lucrezia Europe M.D.   On: 02/07/2018 09:20   Ct Cerebral Perfusion W Contrast  Result Date: 02/06/2018 CLINICAL DATA:  Diplopia and dizziness. Third nerve palsy. Concern for posterior circulation/brainstem infarct. EXAM: CT ANGIOGRAPHY HEAD AND NECK CT PERFUSION BRAIN TECHNIQUE: Multidetector CT imaging of the head and neck was performed using the standard protocol during bolus administration of intravenous contrast. Multiplanar CT image reconstructions and MIPs were obtained to evaluate the vascular anatomy. Carotid stenosis measurements (when applicable) are obtained utilizing NASCET criteria, using the distal internal carotid diameter as the denominator. Multiphase CT imaging of the brain was performed following IV bolus contrast injection. Subsequent  parametric perfusion maps were calculated using RAPID software. CONTRAST:  154mL ISOVUE-370 IOPAMIDOL (ISOVUE-370) INJECTION 76% COMPARISON:  None. FINDINGS: CT HEAD FINDINGS Brain: No acute large territory infarct, intracranial hemorrhage, mass, midline shift, or extra-axial fluid collection is identified. 9 mm hypodensity in the ventral right thalamus has a chronic appearance on coronal and sagittal reformats. Scattered subcortical and periventricular white matter hypodensities are nonspecific but compatible with mild chronic small vessel ischemic disease. The ventricles and sulci are normal. Vascular: Calcified atherosclerosis at the skull base. Skull: No fracture. Nonspecific 2 cm lucency in the right frontal bone. Sinuses/Orbits: Visualized paranasal sinuses and mastoid air cells are clear. Visualized orbits are unremarkable. Other: None. ASPECTS [facility] Stroke Program Early CT Score) Not scored due to symptoms suggesting a posterior fossa infarct. CTA NECK FINDINGS Aortic arch: Standard 3 vessel aortic arch. Moderate aortic atherosclerosis. Widely patent arch vessel origins. Right carotid system: Patent with calcified and soft plaque at the carotid bifurcation resulting in 50% proximal ICA stenosis. Left carotid system: Patent with calcified plaque at the carotid bifurcation resulting in 40% proximal ICA stenosis. Vertebral arteries: Both vertebral arteries are patent with the left being strongly dominant. There is scattered nonstenotic left vertebral artery plaque. The right vertebral artery is diffusely hypoplastic without significant focal stenosis. Skeleton: Lower cervical disc degeneration, severe at C6-7. Other neck: No mass or enlarged lymph nodes. Upper chest: Grossly clear lung apices allowing for motion artifact. Review of the MIP images confirms the above findings CTA HEAD FINDINGS Anterior circulation: The internal carotid arteries are patent from skull base to carotid termini with siphon  atherosclerosis bilaterally. There is mild-to-moderate focal proximal right supraclinoid stenosis (series 13, image 240). MCAs are patent without evidence of proximal branch occlusion or flow limiting proximal stenosis. The ACAs are patent without significant right A1 stenosis. The left A1 segment is diffusely small and irregular with superimposed severe focal narrowing including of its origin. No aneurysm or vascular malformation. Posterior circulation: The intracranial  left vertebral artery is patent with mild atherosclerotic irregularity but no stenosis. The intracranial right vertebral artery is diminutive, particularly distal to the PICA origin with full patency to the vertebrobasilar junction difficult to confirm due to its small size resulting in poor visualization of the distal most right V4 segment. Patent PICA origins are identified bilaterally, with the left PICA appearing dominant. The right PICA is small and poorly visualized. The SCAs are small and poorly evaluated, particularly on the left. The basilar artery is patent with mild diffuse irregularity but no stenosis. There is likely a diminutive right posterior communicating artery. The PCAs are patent with branch vessel irregularity but no evidence of significant proximal stenosis. No aneurysm or vascular malformation. Venous sinuses: Patent. Anatomic variants: None. Delayed phase: No abnormal enhancement. Review of the MIP images confirms the above findings CT Brain Perfusion Findings: CBF (<30%) Volume: 86mL Perfusion (Tmax>6.0s) volume: 42mL, located in the anteromedial left frontal lobe (ACA territory) and right temporoparietal white matter Mismatch Volume: 8mL Infarction Location: None. Preserved cerebral blood flow and blood volume in the above-mentioned areas with delayed perfusion. IMPRESSION: 1. No intracranial hemorrhage or acute infarct evident on CT. Mild chronic small vessel ischemic disease and a likely chronic lacunar infarct in the  right thalamus. 2. No emergent large vessel occlusion. 3. Strongly dominant left vertebral artery without stenosis. 4. Diffusely hypoplastic right vertebral artery, poorly visualized distal to the PICA origin. 5. Bilateral proximal ICA stenoses, 50% on the right and 40% on the left. 6. Severe left A1 stenoses with mildly delayed perfusion in the medial left frontal lobe but no evidence of core infarct. 7. Mild-to-moderate right supraclinoid ICA stenosis. 8.  Aortic Atherosclerosis (ICD10-I70.0). These results were called by telephone at the time of interpretation on 02/06/2018 at 2:55 pm to Dr. Marjean Donna , who verbally acknowledged these results. Electronically Signed   By: Logan Bores M.D.   On: 02/06/2018 15:29   Ct Head Code Stroke Wo Contrast  Result Date: 02/06/2018 CLINICAL DATA:  Diplopia and dizziness. Third nerve palsy. Concern for posterior circulation/brainstem infarct. EXAM: CT ANGIOGRAPHY HEAD AND NECK CT PERFUSION BRAIN TECHNIQUE: Multidetector CT imaging of the head and neck was performed using the standard protocol during bolus administration of intravenous contrast. Multiplanar CT image reconstructions and MIPs were obtained to evaluate the vascular anatomy. Carotid stenosis measurements (when applicable) are obtained utilizing NASCET criteria, using the distal internal carotid diameter as the denominator. Multiphase CT imaging of the brain was performed following IV bolus contrast injection. Subsequent parametric perfusion maps were calculated using RAPID software. CONTRAST:  149mL ISOVUE-370 IOPAMIDOL (ISOVUE-370) INJECTION 76% COMPARISON:  None. FINDINGS: CT HEAD FINDINGS Brain: No acute large territory infarct, intracranial hemorrhage, mass, midline shift, or extra-axial fluid collection is identified. 9 mm hypodensity in the ventral right thalamus has a chronic appearance on coronal and sagittal reformats. Scattered subcortical and periventricular white matter hypodensities are  nonspecific but compatible with mild chronic small vessel ischemic disease. The ventricles and sulci are normal. Vascular: Calcified atherosclerosis at the skull base. Skull: No fracture. Nonspecific 2 cm lucency in the right frontal bone. Sinuses/Orbits: Visualized paranasal sinuses and mastoid air cells are clear. Visualized orbits are unremarkable. Other: None. ASPECTS [facility] Stroke Program Early CT Score) Not scored due to symptoms suggesting a posterior fossa infarct. CTA NECK FINDINGS Aortic arch: Standard 3 vessel aortic arch. Moderate aortic atherosclerosis. Widely patent arch vessel origins. Right carotid system: Patent with calcified and soft plaque at the carotid bifurcation resulting in 50%  proximal ICA stenosis. Left carotid system: Patent with calcified plaque at the carotid bifurcation resulting in 40% proximal ICA stenosis. Vertebral arteries: Both vertebral arteries are patent with the left being strongly dominant. There is scattered nonstenotic left vertebral artery plaque. The right vertebral artery is diffusely hypoplastic without significant focal stenosis. Skeleton: Lower cervical disc degeneration, severe at C6-7. Other neck: No mass or enlarged lymph nodes. Upper chest: Grossly clear lung apices allowing for motion artifact. Review of the MIP images confirms the above findings CTA HEAD FINDINGS Anterior circulation: The internal carotid arteries are patent from skull base to carotid termini with siphon atherosclerosis bilaterally. There is mild-to-moderate focal proximal right supraclinoid stenosis (series 13, image 240). MCAs are patent without evidence of proximal branch occlusion or flow limiting proximal stenosis. The ACAs are patent without significant right A1 stenosis. The left A1 segment is diffusely small and irregular with superimposed severe focal narrowing including of its origin. No aneurysm or vascular malformation. Posterior circulation: The intracranial left vertebral artery  is patent with mild atherosclerotic irregularity but no stenosis. The intracranial right vertebral artery is diminutive, particularly distal to the PICA origin with full patency to the vertebrobasilar junction difficult to confirm due to its small size resulting in poor visualization of the distal most right V4 segment. Patent PICA origins are identified bilaterally, with the left PICA appearing dominant. The right PICA is small and poorly visualized. The SCAs are small and poorly evaluated, particularly on the left. The basilar artery is patent with mild diffuse irregularity but no stenosis. There is likely a diminutive right posterior communicating artery. The PCAs are patent with branch vessel irregularity but no evidence of significant proximal stenosis. No aneurysm or vascular malformation. Venous sinuses: Patent. Anatomic variants: None. Delayed phase: No abnormal enhancement. Review of the MIP images confirms the above findings CT Brain Perfusion Findings: CBF (<30%) Volume: 10mL Perfusion (Tmax>6.0s) volume: 15mL, located in the anteromedial left frontal lobe (ACA territory) and right temporoparietal white matter Mismatch Volume: 53mL Infarction Location: None. Preserved cerebral blood flow and blood volume in the above-mentioned areas with delayed perfusion. IMPRESSION: 1. No intracranial hemorrhage or acute infarct evident on CT. 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 2. No emergent large vessel occlusion. 3. Strongly dominant left vertebral artery without stenosis. 4. Diffusely hypoplastic right vertebral artery, poorly visualized distal to the PICA origin. 5. Bilateral proximal ICA stenoses, 50% on the right and 40% on the left. 6. Severe left A1 stenoses with mildly delayed perfusion in the medial left frontal lobe but no evidence of core infarct. 7. Mild-to-moderate right supraclinoid ICA stenosis. 8.  Aortic Atherosclerosis (ICD10-I70.0).  These results were called by telephone at the time of interpretation on 02/06/2018 at 2:55 pm to Dr. Marjean Donna , who verbally acknowledged these results. Electronically Signed   By: Logan Bores M.D.   On: 02/06/2018 15:29    EKG:   Orders placed or performed during the hospital encounter of 02/06/18  . ED EKG  . ED EKG  . ED EKG  . ED EKG  . EKG 12-Lead  . EKG 12-Lead  . EKG 12-Lead  . EKG 12-Lead    ASSESSMENT AND PLAN:    TIA MRI of the brain is negative  carotid Dopplers left ICA with less than 50% stenosis in the right ICA 50 to 69% stenosis Echocardiogram is done results are pending Patient has passed bedside swallow evaluation Seen and evaluated by neurology  who is recommending better blood pressure control, to increase the Lipitor to 40 mg and prophylaxis with aspirin 81 mg and Plavix 75 mg TSH is normal, hemoglobin A1c 5.5 Patient was seen and evaluated by PT and OT no needs identified Outpatient follow-up with the neurology after discharge   nonsustained V. Tach 12-lead EKG Echocardiogram ordered results are pending Magnesium is in the normal range replete potassium Amiodarone bolus once Cardiology consult placed to Dr. Nehemiah Massed  continue monitoring patient on telemetry    Hypertension emergency. Metoprolol 25  mg p.o. twice daily is added to the regimen will titrate as needed Continue amlodipine, lisinopril-HCTZ  Hypokalemia.  Give potassium supplement and follow-up level.  History of prostate cancer.  Follow-up oncologist as outpatient.  Tobacco abuse.  Smoking cessation was counseled for 4 minutes, nicotine patch.      All the records are reviewed and case discussed with Care Management/Social Workerr. Management plans discussed with the patient, family and they are in agreement.  CODE STATUS: FC   TOTAL TIME TAKING CARE OF THIS PATIENT: 36  minutes.   POSSIBLE D/C IN 1-2  DAYS, DEPENDING ON CLINICAL CONDITION.  Note: This dictation  was prepared with Dragon dictation along with smaller phrase technology. Any transcriptional errors that result from this process are unintentional.   Nicholes Mango M.D on 02/07/2018 at 1:49 PM  Between 7am to 6pm - Pager - [phone] After 6pm go to [email or web address] - password [email or web address] [facility]  [facility]  Office  [phone]  CC: Primary care physician; Wenda Low, MD

## 2018-02-07 NOTE — Progress Notes (Signed)
SLP Cancellation Note  Patient Details Name: Alex Banks MRN: [id number] DOB: [birth year]   Cancelled treatment:       Reason Eval/Treat Not Completed: SLP screened, no needs identified, will sign off(chart reviewed; consulted NSG ). Pt denied any difficulty swallowing to NSG and is currently on a regular diet; tolerates swallowing pills w/ water per NSG. Pt conversed at conversational level w/ NSG w/out deficits noted; pt has not reported any speech-language deficits.  No further skilled ST services indicated as pt appears at his baseline. Admitted for diplopia per MD note. NSG to reconsult if any change in status.     Orinda Kenner, MS, CCC-SLP Watson,Katherine 02/07/2018, 8:47 AM

## 2018-02-07 NOTE — Progress Notes (Signed)
Dr Margaretmary Eddy notified that patient had 15 beat run of V Tach. MD acknowledged, no new orders given.

## 2018-02-07 NOTE — Progress Notes (Signed)
Patient transfer to 2A. Report given to [employer].

## 2018-02-07 NOTE — Consult Note (Signed)
MEDICATION RELATED CONSULT NOTE - INITIAL   Pharmacy Consult for electrolytes Indication: hypokalemia  No Known Allergies  Patient Measurements: Height: 5\' 10"  (177.8 cm) Weight: 186 lb 3.2 oz (84.5 kg) IBW/kg (Calculated) : 73 Adjusted Body Weight:   Vital Signs: Temp: 97.9 F (36.6 C) (04/20 0819) Temp Source: Oral (04/20 0819) BP: 163/98 (04/20 0819) Pulse Rate: 78 (04/20 0819) Intake/Output from previous day: 04/19 0701 - 04/20 0700 In: 1326.7 [P.O.:240; I.V.:1086.7] Out: 250 [Urine:250] Intake/Output from this shift: No intake/output data recorded.  Labs: Recent Labs    02/06/18 1411  WBC 12.8*  HGB 17.2  HCT 49.7  PLT 217  APTT 27  CREATININE 0.87  MG 2.1  ALBUMIN 5.0  PROT 8.5*  AST 22  ALT 24  ALKPHOS 69  BILITOT 0.8   Estimated Creatinine Clearance: 90.9 mL/min (by C-G formula based on SCr of 0.87 mg/dL).   Microbiology: No results found for this or any previous visit (from the past 720 hour(s)).  Medical History: Past Medical History:  Diagnosis Date  . Hypertension   . Prostate cancer ([place])     Medications:  Scheduled:  . amLODipine  10 mg Oral Daily  . aspirin  300 mg Rectal Daily   Or  . aspirin EC  325 mg Oral Daily  . atorvastatin  40 mg Oral q1800  . enoxaparin (LOVENOX) injection  40 mg Subcutaneous Q24H  . lisinopril  20 mg Oral Daily   And  . hydrochlorothiazide  25 mg Oral Daily  . nicotine  14 mg Transdermal Daily    Assessment: Pt is a 62 year old male admitted with dizziness/double vision. CT did not show stroke, but pt admitted for further workup. K on admission was 3.2, Mg =2.1. Pt received 40 po KCL last night and another 40 po this AM after labs were drawn.  Goal of Therapy:  Normalization of electrolyes  Plan:  K this AM was 3.3. Pt already received 40 MEQ po of KCL. Recheck in the AM  [employer], Pharm.D, BCPS Clinical Pharmacist  02/07/2018,10:24 AM

## 2018-02-07 NOTE — Progress Notes (Signed)
OT Cancellation Note  Patient Details Name: Alex Banks MRN: [id number] DOB: Oct 26, 1955   Cancelled Treatment:    Reason Eval/Treat Not Completed: Patient at procedure or test/ unavailable. Order received, chart reviewed. Pt out of room for diagnostic testing upon attempt. Will re-attempt OT evaluation at later date/time as pt is available and medically appropriate.  Jeni Salles, MPH, MS, OTR/L ascom [phone] 02/07/18, 9:30 AM

## 2018-02-07 NOTE — Consult Note (Signed)
Referring Physician: Gouru    Chief Complaint: Diplopia  HPI: Alex Banks is an 62 y.o. male who reports acute onset of double vision on yesterday.  Reports that he had horizontal diplopia on looking to the right.  Symptoms lasted about 3-4 hours and resolved spontaneously.  Patient presented for evaluation.  Initial NIHSS of 1.  Date last known well: Date: 02/06/2018 Time last known well: Time: 11:00 tPA Given: No: Resolution of symptoms, outside time window  Past Medical History:  Diagnosis Date  . Hypertension   . Prostate cancer [facility])     Past Surgical History:  Procedure Laterality Date  . PROSTATE BIOPSY      Family History  Problem Relation Age of Onset  . Cancer Mother        liver  . Cancer Brother        bone   Social History:  reports that he has been smoking cigarettes.  He has a 4.40 pack-year smoking history. He has never used smokeless tobacco. He reports that he does not drink alcohol or use drugs.  Allergies: No Known Allergies  Medications:  I have reviewed the patient's current medications. Prior to Admission:  Medications Prior to Admission  Medication Sig Dispense Refill Last Dose  . amLODipine (NORVASC) 10 MG tablet Take 10 mg by mouth daily.   02/06/2018 at 0800  . aspirin EC 81 MG tablet Take 81 mg by mouth daily.   02/06/2018 at 0800  . atorvastatin (LIPITOR) 20 MG tablet Take 20 mg by mouth daily.   02/06/2018 at 0800  . lisinopril-hydrochlorothiazide (PRINZIDE,ZESTORETIC) 20-25 MG tablet Take 1 tablet by mouth daily.   02/06/2018 at 0800   Scheduled: . amLODipine  10 mg Oral Daily  . aspirin  300 mg Rectal Daily   Or  . aspirin EC  325 mg Oral Daily  . atorvastatin  40 mg Oral q1800  . enoxaparin (LOVENOX) injection  40 mg Subcutaneous Q24H  . lisinopril  20 mg Oral Daily   And  . hydrochlorothiazide  25 mg Oral Daily  . nicotine  14 mg Transdermal Daily    ROS: History obtained from the patient  General ROS: negative for -  chills, fatigue, fever, night sweats, weight gain or weight loss Psychological ROS: negative for - behavioral disorder, hallucinations, memory difficulties, mood swings or suicidal ideation Ophthalmic ROS: negative for - blurry vision, double vision, eye pain or loss of vision ENT ROS: negative for - epistaxis, nasal discharge, oral lesions, sore throat, tinnitus or vertigo Allergy and Immunology ROS: negative for - hives or itchy/watery eyes Hematological and Lymphatic ROS: negative for - bleeding problems, bruising or swollen lymph nodes Endocrine ROS: negative for - galactorrhea, hair pattern changes, polydipsia/polyuria or temperature intolerance Respiratory ROS: negative for - cough, hemoptysis, shortness of breath or wheezing Cardiovascular ROS: negative for - chest pain, dyspnea on exertion, edema or irregular heartbeat Gastrointestinal ROS: negative for - abdominal pain, diarrhea, hematemesis, nausea/vomiting or stool incontinence Genito-Urinary ROS: negative for - dysuria, hematuria, incontinence or urinary frequency/urgency Musculoskeletal ROS: negative for - joint swelling or muscular weakness Neurological ROS: as noted in HPI Dermatological ROS: negative for rash and skin lesion changes  Physical Examination: Blood pressure (!) 163/98, pulse 78, temperature 97.9 F (36.6 C), temperature source Oral, resp. rate 18, height 5\' 10"  (1.778 m), weight 84.5 kg (186 lb 3.2 oz), SpO2 98 %.  HEENT-  Normocephalic, no lesions, without obvious abnormality.  Normal external eye and conjunctiva.  Normal TM's  bilaterally.  Normal auditory canals and external ears. Normal external nose, mucus membranes and septum.  Normal pharynx. Cardiovascular- S1, S2 normal, pulses palpable throughout   Lungs- chest clear, no wheezing, rales, normal symmetric air entry Abdomen- soft, non-tender; bowel sounds normal; no masses,  no organomegaly Extremities- no edema Lymph-no adenopathy  palpable Musculoskeletal-no joint tenderness, deformity or swelling Skin-warm and dry, no hyperpigmentation, vitiligo, or suspicious lesions  Neurological Examination   Mental Status: Alert, oriented, thought content appropriate.  Speech fluent without evidence of aphasia.  Able to follow 3 step commands without difficulty. Cranial Nerves: II: Discs flat bilaterally; Visual fields grossly normal, pupils equal, round, reactive to light and accommodation III,IV, VI: ptosis not present, extra-ocular motions intact bilaterally V,VII: smile symmetric, facial light touch sensation normal bilaterally VIII: hearing normal bilaterally IX,X: gag reflex present XI: bilateral shoulder shrug XII: midline tongue extension Motor: Right : Upper extremity   5/5    Left:     Upper extremity   5/5  Lower extremity   5/5     Lower extremity   5/5 Tone and bulk:normal tone throughout; no atrophy noted Sensory: Pinprick and light touch intact throughout, bilaterally Deep Tendon Reflexes: 2+ and symmetric with absent AJ's bilaterally Plantars: Right: downgoing   Left: downgoing Cerebellar: Normal finger-to-nose, normal rapid alternating movements and normal heel-to-shin testing bilatereally Gait: normal gait and station    Laboratory Studies:  Basic Metabolic Panel: Recent Labs  Lab 02/06/18 1411 02/07/18 0349  NA 135  --   K 3.2* 3.3*  CL 99*  --   CO2 26  --   GLUCOSE 119*  --   BUN 15  --   CREATININE 0.87  --   CALCIUM 9.6  --   MG 2.1  --     Liver Function Tests: Recent Labs  Lab 02/06/18 1411  AST 22  ALT 24  ALKPHOS 69  BILITOT 0.8  PROT 8.5*  ALBUMIN 5.0   No results for input(s): LIPASE, AMYLASE in the last 168 hours. No results for input(s): AMMONIA in the last 168 hours.  CBC: Recent Labs  Lab 02/06/18 1411  WBC 12.8*  NEUTROABS 10.4*  HGB 17.2  HCT 49.7  MCV 88.1  PLT 217    Cardiac Enzymes: Recent Labs  Lab 02/06/18 1411  TROPONINI <0.03     BNP: Invalid input(s): POCBNP  CBG: Recent Labs  Lab 02/06/18 1412 02/06/18 2256  GLUCAP 116* 117*    Microbiology: No results found for this or any previous visit.  Coagulation Studies: Recent Labs    02/06/18 1411  LABPROT 12.5  INR 0.94    Urinalysis:  Recent Labs  Lab 02/06/18 1913  COLORURINE STRAW*  LABSPEC 1.011  PHURINE 7.0  GLUCOSEU NEGATIVE  HGBUR NEGATIVE  BILIRUBINUR NEGATIVE  KETONESUR NEGATIVE  PROTEINUR NEGATIVE  NITRITE NEGATIVE  LEUKOCYTESUR NEGATIVE    Lipid Panel:    Component Value Date/Time   CHOL 181 02/07/2018 0349   TRIG 149 02/07/2018 0349   HDL 39 (L) 02/07/2018 0349   CHOLHDL 4.6 02/07/2018 0349   VLDL 30 02/07/2018 0349   LDLCALC 112 (H) 02/07/2018 0349    HgbA1C:  Lab Results  Component Value Date   HGBA1C 5.5 02/07/2018    Urine Drug Screen:      Component Value Date/Time   LABOPIA NONE DETECTED 02/06/2018 1913   COCAINSCRNUR NONE DETECTED 02/06/2018 1913   LABBENZ NONE DETECTED 02/06/2018 1913   AMPHETMU NONE DETECTED 02/06/2018 1913   THCU  NONE DETECTED 02/06/2018 1913   LABBARB NONE DETECTED 02/06/2018 1913    Alcohol Level:  Recent Labs  Lab 02/06/18 1411  ETH <10    Other results: EKG: sinus rhythm at 88 bpm.  Imaging: Ct Angio Head W Or Wo Contrast  Result Date: 02/06/2018 CLINICAL DATA:  Diplopia and dizziness. Third nerve palsy. Concern for posterior circulation/brainstem infarct. EXAM: CT ANGIOGRAPHY HEAD AND NECK CT PERFUSION BRAIN TECHNIQUE: Multidetector CT imaging of the head and neck was performed using the standard protocol during bolus administration of intravenous contrast. Multiplanar CT image reconstructions and MIPs were obtained to evaluate the vascular anatomy. Carotid stenosis measurements (when applicable) are obtained utilizing NASCET criteria, using the distal internal carotid diameter as the denominator. Multiphase CT imaging of the brain was performed following IV bolus  contrast injection. Subsequent parametric perfusion maps were calculated using RAPID software. CONTRAST:  168mL ISOVUE-370 IOPAMIDOL (ISOVUE-370) INJECTION 76% COMPARISON:  None. FINDINGS: CT HEAD FINDINGS Brain: No acute large territory infarct, intracranial hemorrhage, mass, midline shift, or extra-axial fluid collection is identified. 9 mm hypodensity in the ventral right thalamus has a chronic appearance on coronal and sagittal reformats. Scattered subcortical and periventricular white matter hypodensities are nonspecific but compatible with mild chronic small vessel ischemic disease. The ventricles and sulci are normal. Vascular: Calcified atherosclerosis at the skull base. Skull: No fracture. Nonspecific 2 cm lucency in the right frontal bone. Sinuses/Orbits: Visualized paranasal sinuses and mastoid air cells are clear. Visualized orbits are unremarkable. Other: None. ASPECTS [facility] Stroke Program Early CT Score) Not scored due to symptoms suggesting a posterior fossa infarct. CTA NECK FINDINGS Aortic arch: Standard 3 vessel aortic arch. Moderate aortic atherosclerosis. Widely patent arch vessel origins. Right carotid system: Patent with calcified and soft plaque at the carotid bifurcation resulting in 50% proximal ICA stenosis. Left carotid system: Patent with calcified plaque at the carotid bifurcation resulting in 40% proximal ICA stenosis. Vertebral arteries: Both vertebral arteries are patent with the left being strongly dominant. There is scattered nonstenotic left vertebral artery plaque. The right vertebral artery is diffusely hypoplastic without significant focal stenosis. Skeleton: Lower cervical disc degeneration, severe at C6-7. Other neck: No mass or enlarged lymph nodes. Upper chest: Grossly clear lung apices allowing for motion artifact. Review of the MIP images confirms the above findings CTA HEAD FINDINGS Anterior circulation: The internal carotid arteries are patent from skull base to  carotid termini with siphon atherosclerosis bilaterally. There is mild-to-moderate focal proximal right supraclinoid stenosis (series 13, image 240). MCAs are patent without evidence of proximal branch occlusion or flow limiting proximal stenosis. The ACAs are patent without significant right A1 stenosis. The left A1 segment is diffusely small and irregular with superimposed severe focal narrowing including of its origin. No aneurysm or vascular malformation. Posterior circulation: The intracranial left vertebral artery is patent with mild atherosclerotic irregularity but no stenosis. The intracranial right vertebral artery is diminutive, particularly distal to the PICA origin with full patency to the vertebrobasilar junction difficult to confirm due to its small size resulting in poor visualization of the distal most right V4 segment. Patent PICA origins are identified bilaterally, with the left PICA appearing dominant. The right PICA is small and poorly visualized. The SCAs are small and poorly evaluated, particularly on the left. The basilar artery is patent with mild diffuse irregularity but no stenosis. There is likely a diminutive right posterior communicating artery. The PCAs are patent with branch vessel irregularity but no evidence of significant proximal stenosis. No aneurysm or  vascular malformation. Venous sinuses: Patent. Anatomic variants: None. Delayed phase: No abnormal enhancement. Review of the MIP images confirms the above findings CT Brain Perfusion Findings: CBF (<30%) Volume: 32mL Perfusion (Tmax>6.0s) volume: 50mL, located in the anteromedial left frontal lobe (ACA territory) and right temporoparietal white matter Mismatch Volume: 64mL Infarction Location: None. Preserved cerebral blood flow and blood volume in the above-mentioned areas with delayed perfusion. IMPRESSION: 1. No intracranial hemorrhage or acute infarct evident on CT. Mild chronic small vessel ischemic disease and a likely  chronic lacunar infarct in the right thalamus. 2. No emergent large vessel occlusion. 3. Strongly dominant left vertebral artery without stenosis. 4. Diffusely hypoplastic right vertebral artery, poorly visualized distal to the PICA origin. 5. Bilateral proximal ICA stenoses, 50% on the right and 40% on the left. 6. Severe left A1 stenoses with mildly delayed perfusion in the medial left frontal lobe but no evidence of core infarct. 7. Mild-to-moderate right supraclinoid ICA stenosis. 8.  Aortic Atherosclerosis (ICD10-I70.0). These results were called by telephone at the time of interpretation on 02/06/2018 at 2:55 pm to Dr. Marjean Donna , who verbally acknowledged these results. Electronically Signed   By: Logan Bores M.D.   On: 02/06/2018 15:29   Ct Angio Neck W And/or Wo Contrast  Result Date: 02/06/2018 CLINICAL DATA:  Diplopia and dizziness. Third nerve palsy. Concern for posterior circulation/brainstem infarct. EXAM: CT ANGIOGRAPHY HEAD AND NECK CT PERFUSION BRAIN TECHNIQUE: Multidetector CT imaging of the head and neck was performed using the standard protocol during bolus administration of intravenous contrast. Multiplanar CT image reconstructions and MIPs were obtained to evaluate the vascular anatomy. Carotid stenosis measurements (when applicable) are obtained utilizing NASCET criteria, using the distal internal carotid diameter as the denominator. Multiphase CT imaging of the brain was performed following IV bolus contrast injection. Subsequent parametric perfusion maps were calculated using RAPID software. CONTRAST:  167mL ISOVUE-370 IOPAMIDOL (ISOVUE-370) INJECTION 76% COMPARISON:  None. FINDINGS: CT HEAD FINDINGS Brain: No acute large territory infarct, intracranial hemorrhage, mass, midline shift, or extra-axial fluid collection is identified. 9 mm hypodensity in the ventral right thalamus has a chronic appearance on coronal and sagittal reformats. Scattered subcortical and periventricular  white matter hypodensities are nonspecific but compatible with mild chronic small vessel ischemic disease. The ventricles and sulci are normal. Vascular: Calcified atherosclerosis at the skull base. Skull: No fracture. Nonspecific 2 cm lucency in the right frontal bone. Sinuses/Orbits: Visualized paranasal sinuses and mastoid air cells are clear. Visualized orbits are unremarkable. Other: None. ASPECTS [facility] Stroke Program Early CT Score) Not scored due to symptoms suggesting a posterior fossa infarct. CTA NECK FINDINGS Aortic arch: Standard 3 vessel aortic arch. Moderate aortic atherosclerosis. Widely patent arch vessel origins. Right carotid system: Patent with calcified and soft plaque at the carotid bifurcation resulting in 50% proximal ICA stenosis. Left carotid system: Patent with calcified plaque at the carotid bifurcation resulting in 40% proximal ICA stenosis. Vertebral arteries: Both vertebral arteries are patent with the left being strongly dominant. There is scattered nonstenotic left vertebral artery plaque. The right vertebral artery is diffusely hypoplastic without significant focal stenosis. Skeleton: Lower cervical disc degeneration, severe at C6-7. Other neck: No mass or enlarged lymph nodes. Upper chest: Grossly clear lung apices allowing for motion artifact. Review of the MIP images confirms the above findings CTA HEAD FINDINGS Anterior circulation: The internal carotid arteries are patent from skull base to carotid termini with siphon atherosclerosis bilaterally. There is mild-to-moderate focal proximal right supraclinoid stenosis (series 13, image 240).  MCAs are patent without evidence of proximal branch occlusion or flow limiting proximal stenosis. The ACAs are patent without significant right A1 stenosis. The left A1 segment is diffusely small and irregular with superimposed severe focal narrowing including of its origin. No aneurysm or vascular malformation. Posterior circulation: The  intracranial left vertebral artery is patent with mild atherosclerotic irregularity but no stenosis. The intracranial right vertebral artery is diminutive, particularly distal to the PICA origin with full patency to the vertebrobasilar junction difficult to confirm due to its small size resulting in poor visualization of the distal most right V4 segment. Patent PICA origins are identified bilaterally, with the left PICA appearing dominant. The right PICA is small and poorly visualized. The SCAs are small and poorly evaluated, particularly on the left. The basilar artery is patent with mild diffuse irregularity but no stenosis. There is likely a diminutive right posterior communicating artery. The PCAs are patent with branch vessel irregularity but no evidence of significant proximal stenosis. No aneurysm or vascular malformation. Venous sinuses: Patent. Anatomic variants: None. Delayed phase: No abnormal enhancement. Review of the MIP images confirms the above findings CT Brain Perfusion Findings: CBF (<30%) Volume: 80mL Perfusion (Tmax>6.0s) volume: 13mL, located in the anteromedial left frontal lobe (ACA territory) and right temporoparietal white matter Mismatch Volume: 66mL Infarction Location: None. Preserved cerebral blood flow and blood volume in the above-mentioned areas with delayed perfusion. IMPRESSION: 1. No intracranial hemorrhage or acute infarct evident on CT. 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 2. No emergent large vessel occlusion. 3. Strongly dominant left vertebral artery without stenosis. 4. Diffusely hypoplastic right vertebral artery, poorly visualized distal to the PICA origin. 5. Bilateral proximal ICA stenoses, 50% on the right and 40% on the left. 6. Severe left A1 stenoses with mildly delayed perfusion in the medial left frontal lobe but no evidence of core infarct. 7. Mild-to-moderate right supraclinoid ICA stenosis. 8.   Aortic Atherosclerosis (ICD10-I70.0). These results were called by telephone at the time of interpretation on 02/06/2018 at 2:55 pm to Dr. Marjean Donna , who verbally acknowledged these results. Electronically Signed   By: Logan Bores M.D.   On: 02/06/2018 15:29   Mr Brain Wo Contrast  Result Date: 02/06/2018 CLINICAL DATA:  Dizziness and double vision. History of prostate cancer and hypertension. EXAM: MRI HEAD WITHOUT CONTRAST TECHNIQUE: Multiplanar, multiecho pulse sequences of the brain and surrounding structures were obtained without intravenous contrast. COMPARISON:  Head CT 02/06/2018 FINDINGS: BRAIN: The midline structures are normal. There is no acute infarct or acute hemorrhage. No mass lesion, hydrocephalus, dural abnormality or extra-axial collection. Early confluent hyperintense T2-weighted signal of the periventricular and deep white matter, most commonly due to chronic ischemic microangiopathy. Old right basal ganglia and right thalamus lacunar infarcts. No age-advanced or lobar predominant atrophy. No chronic microhemorrhage or superficial siderosis. VASCULAR: Major intracranial arterial and venous sinus flow voids are preserved. SKULL AND UPPER CERVICAL SPINE: The visualized skull base, calvarium, upper cervical spine and extracranial soft tissues are normal. SINUSES/ORBITS: No fluid levels or advanced mucosal thickening. No mastoid or middle ear effusion. Normal orbits. IMPRESSION: 1. No acute intracranial abnormality. 2. Chronic ischemic microangiopathy. Electronically Signed   By: Ulyses Jarred M.D.   On: 02/06/2018 22:18   US Carotid Bilateral (at [facility] And Ap Only)  Result Date: 02/07/2018 CLINICAL DATA:  Stroke EXAM: BILATERAL CAROTID DUPLEX ULTRASOUND TECHNIQUE: Pearline Cables scale imaging, color Doppler and duplex ultrasound was performed of bilateral  carotid and vertebral arteries in the neck. COMPARISON:  CTA 4 19 2019 TECHNIQUE: Quantification of carotid stenosis is based on velocity  parameters that correlate the residual internal carotid diameter with NASCET-based stenosis levels, using the diameter of the distal internal carotid lumen as the denominator for stenosis measurement. The following velocity measurements were obtained: PEAK SYSTOLIC/END DIASTOLIC RIGHT ICA:                     143/40cm/sec CCA:                     [id number] SYSTOLIC ICA/CCA RATIO:  1.9 ECA:                     158cm/sec LEFT ICA:                     102/32cm/sec CCA:                     78/[id number] SYSTOLIC ICA/CCA RATIO:  1.1 ECA:                     184cm/sec FINDINGS: RIGHT CAROTID ARTERY: Scattered nonocclusive plaque through the common carotid artery. Heavier eccentric somewhat irregular plaque in the carotid bulb resulting in at least mild stenosis. Normal waveforms and color Doppler signal. RIGHT VERTEBRAL ARTERY:  Normal flow direction and waveform. LEFT CAROTID ARTERY: Scattered nonocclusive plaque through the common carotid artery and bulb. Eccentric plaque in the proximal ICA resulting in at least mild stenosis. Normal waveforms and color Doppler signal. A nonspecific cardiac arrhythmia noted. LEFT VERTEBRAL ARTERY: Normal flow direction and waveform. IMPRESSION: 1. Bilateral carotid bifurcation and proximal ICA plaque, resulting in less than 50% diameter stenosis on the left, 50-69% diameter stenosis on the right. 2.  Antegrade bilateral vertebral arterial flow. Electronically Signed   By: Lucrezia Europe M.D.   On: 02/07/2018 09:20   Ct Cerebral Perfusion W Contrast  Result Date: 02/06/2018 CLINICAL DATA:  Diplopia and dizziness. Third nerve palsy. Concern for posterior circulation/brainstem infarct. EXAM: CT ANGIOGRAPHY HEAD AND NECK CT PERFUSION BRAIN TECHNIQUE: Multidetector CT imaging of the head and neck was performed using the standard protocol during bolus administration of intravenous contrast. Multiplanar CT image reconstructions and MIPs were obtained to evaluate the vascular anatomy. Carotid  stenosis measurements (when applicable) are obtained utilizing NASCET criteria, using the distal internal carotid diameter as the denominator. Multiphase CT imaging of the brain was performed following IV bolus contrast injection. Subsequent parametric perfusion maps were calculated using RAPID software. CONTRAST:  154mL ISOVUE-370 IOPAMIDOL (ISOVUE-370) INJECTION 76% COMPARISON:  None. FINDINGS: CT HEAD FINDINGS Brain: No acute large territory infarct, intracranial hemorrhage, mass, midline shift, or extra-axial fluid collection is identified. 9 mm hypodensity in the ventral right thalamus has a chronic appearance on coronal and sagittal reformats. Scattered subcortical and periventricular white matter hypodensities are nonspecific but compatible with mild chronic small vessel ischemic disease. The ventricles and sulci are normal. Vascular: Calcified atherosclerosis at the skull base. Skull: No fracture. Nonspecific 2 cm lucency in the right frontal bone. Sinuses/Orbits: Visualized paranasal sinuses and mastoid air cells are clear. Visualized orbits are unremarkable. Other: None. ASPECTS [facility] Stroke Program Early CT Score) Not scored due to symptoms suggesting a posterior fossa infarct. CTA NECK FINDINGS Aortic arch: Standard 3 vessel aortic arch. Moderate aortic atherosclerosis. Widely patent arch vessel origins. Right carotid system: Patent with calcified and soft plaque at the carotid  bifurcation resulting in 50% proximal ICA stenosis. Left carotid system: Patent with calcified plaque at the carotid bifurcation resulting in 40% proximal ICA stenosis. Vertebral arteries: Both vertebral arteries are patent with the left being strongly dominant. There is scattered nonstenotic left vertebral artery plaque. The right vertebral artery is diffusely hypoplastic without significant focal stenosis. Skeleton: Lower cervical disc degeneration, severe at C6-7. Other neck: No mass or enlarged lymph nodes. Upper chest:  Grossly clear lung apices allowing for motion artifact. Review of the MIP images confirms the above findings CTA HEAD FINDINGS Anterior circulation: The internal carotid arteries are patent from skull base to carotid termini with siphon atherosclerosis bilaterally. There is mild-to-moderate focal proximal right supraclinoid stenosis (series 13, image 240). MCAs are patent without evidence of proximal branch occlusion or flow limiting proximal stenosis. The ACAs are patent without significant right A1 stenosis. The left A1 segment is diffusely small and irregular with superimposed severe focal narrowing including of its origin. No aneurysm or vascular malformation. Posterior circulation: The intracranial left vertebral artery is patent with mild atherosclerotic irregularity but no stenosis. The intracranial right vertebral artery is diminutive, particularly distal to the PICA origin with full patency to the vertebrobasilar junction difficult to confirm due to its small size resulting in poor visualization of the distal most right V4 segment. Patent PICA origins are identified bilaterally, with the left PICA appearing dominant. The right PICA is small and poorly visualized. The SCAs are small and poorly evaluated, particularly on the left. The basilar artery is patent with mild diffuse irregularity but no stenosis. There is likely a diminutive right posterior communicating artery. The PCAs are patent with branch vessel irregularity but no evidence of significant proximal stenosis. No aneurysm or vascular malformation. Venous sinuses: Patent. Anatomic variants: None. Delayed phase: No abnormal enhancement. Review of the MIP images confirms the above findings CT Brain Perfusion Findings: CBF (<30%) Volume: 79mL Perfusion (Tmax>6.0s) volume: 98mL, located in the anteromedial left frontal lobe (ACA territory) and right temporoparietal white matter Mismatch Volume: 90mL Infarction Location: None. Preserved cerebral blood  flow and blood volume in the above-mentioned areas with delayed perfusion. IMPRESSION: 1. No intracranial hemorrhage or acute infarct evident on CT. 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 2. No emergent large vessel occlusion. 3. Strongly dominant left vertebral artery without stenosis. 4. Diffusely hypoplastic right vertebral artery, poorly visualized distal to the PICA origin. 5. Bilateral proximal ICA stenoses, 50% on the right and 40% on the left. 6. Severe left A1 stenoses with mildly delayed perfusion in the medial left frontal lobe but no evidence of core infarct. 7. Mild-to-moderate right supraclinoid ICA stenosis. 8.  Aortic Atherosclerosis (ICD10-I70.0). These results were called by telephone at the time of interpretation on 02/06/2018 at 2:55 pm to Dr. Marjean Donna , who verbally acknowledged these results. Electronically Signed   By: Logan Bores M.D.   On: 02/06/2018 15:29   Ct Head Code Stroke Wo Contrast  Result Date: 02/06/2018 CLINICAL DATA:  Diplopia and dizziness. Third nerve palsy. Concern for posterior circulation/brainstem infarct. EXAM: CT ANGIOGRAPHY HEAD AND NECK CT PERFUSION BRAIN TECHNIQUE: Multidetector CT imaging of the head and neck was performed using the standard protocol during bolus administration of intravenous contrast. Multiplanar CT image reconstructions and MIPs were obtained to evaluate the vascular anatomy. Carotid stenosis measurements (when applicable) are obtained utilizing NASCET criteria, using the distal internal carotid diameter as the denominator. Multiphase CT imaging of the brain was  performed following IV bolus contrast injection. Subsequent parametric perfusion maps were calculated using RAPID software. CONTRAST:  146mL ISOVUE-370 IOPAMIDOL (ISOVUE-370) INJECTION 76% COMPARISON:  None. FINDINGS: CT HEAD FINDINGS Brain: No acute large territory infarct, intracranial hemorrhage, mass, midline  shift, or extra-axial fluid collection is identified. 9 mm hypodensity in the ventral right thalamus has a chronic appearance on coronal and sagittal reformats. Scattered subcortical and periventricular white matter hypodensities are nonspecific but compatible with mild chronic small vessel ischemic disease. The ventricles and sulci are normal. Vascular: Calcified atherosclerosis at the skull base. Skull: No fracture. Nonspecific 2 cm lucency in the right frontal bone. Sinuses/Orbits: Visualized paranasal sinuses and mastoid air cells are clear. Visualized orbits are unremarkable. Other: None. ASPECTS [facility] Stroke Program Early CT Score) Not scored due to symptoms suggesting a posterior fossa infarct. CTA NECK FINDINGS Aortic arch: Standard 3 vessel aortic arch. Moderate aortic atherosclerosis. Widely patent arch vessel origins. Right carotid system: Patent with calcified and soft plaque at the carotid bifurcation resulting in 50% proximal ICA stenosis. Left carotid system: Patent with calcified plaque at the carotid bifurcation resulting in 40% proximal ICA stenosis. Vertebral arteries: Both vertebral arteries are patent with the left being strongly dominant. There is scattered nonstenotic left vertebral artery plaque. The right vertebral artery is diffusely hypoplastic without significant focal stenosis. Skeleton: Lower cervical disc degeneration, severe at C6-7. Other neck: No mass or enlarged lymph nodes. Upper chest: Grossly clear lung apices allowing for motion artifact. Review of the MIP images confirms the above findings CTA HEAD FINDINGS Anterior circulation: The internal carotid arteries are patent from skull base to carotid termini with siphon atherosclerosis bilaterally. There is mild-to-moderate focal proximal right supraclinoid stenosis (series 13, image 240). MCAs are patent without evidence of proximal branch occlusion or flow limiting proximal stenosis. The ACAs are patent without significant  right A1 stenosis. The left A1 segment is diffusely small and irregular with superimposed severe focal narrowing including of its origin. No aneurysm or vascular malformation. Posterior circulation: The intracranial left vertebral artery is patent with mild atherosclerotic irregularity but no stenosis. The intracranial right vertebral artery is diminutive, particularly distal to the PICA origin with full patency to the vertebrobasilar junction difficult to confirm due to its small size resulting in poor visualization of the distal most right V4 segment. Patent PICA origins are identified bilaterally, with the left PICA appearing dominant. The right PICA is small and poorly visualized. The SCAs are small and poorly evaluated, particularly on the left. The basilar artery is patent with mild diffuse irregularity but no stenosis. There is likely a diminutive right posterior communicating artery. The PCAs are patent with branch vessel irregularity but no evidence of significant proximal stenosis. No aneurysm or vascular malformation. Venous sinuses: Patent. Anatomic variants: None. Delayed phase: No abnormal enhancement. Review of the MIP images confirms the above findings CT Brain Perfusion Findings: CBF (<30%) Volume: 16mL Perfusion (Tmax>6.0s) volume: 92mL, located in the anteromedial left frontal lobe (ACA territory) and right temporoparietal white matter Mismatch Volume: 46mL Infarction Location: None. Preserved cerebral blood flow and blood volume in the above-mentioned areas with delayed perfusion. IMPRESSION: 1. No intracranial hemorrhage or acute infarct evident on CT. 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 2. No emergent large vessel occlusion. 3. Strongly dominant left vertebral artery without stenosis. 4. Diffusely hypoplastic right vertebral artery, poorly visualized distal to the PICA origin. 5. Bilateral proximal ICA stenoses, 50% on the right  and  40% on the left. 6. Severe left A1 stenoses with mildly delayed perfusion in the medial left frontal lobe but no evidence of core infarct. 7. Mild-to-moderate right supraclinoid ICA stenosis. 8.  Aortic Atherosclerosis (ICD10-I70.0). These results were called by telephone at the time of interpretation on 02/06/2018 at 2:55 pm to Dr. Marjean Donna , who verbally acknowledged these results. Electronically Signed   By: Logan Bores M.D.   On: 02/06/2018 15:29    Assessment: 62 y.o. male presenting with an episode of diplopia.  Symptoms resolved.  On ASA at home.  MRI of the brain reviewed and shows no acute changes.  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  Also noted was severe left A1 stenosis. Echocardiogram pending.  A1c 5.5, LDL 112.  Stroke Risk Factors - hyperlipidemia, hypertension and smoking  Plan: 1. PT consult, OT consult, Speech consult 2. Echocardiogram pending 3. Prophylactic therapy-ASA 81mg  and Plavix 75mg  daily 4. NPO until RN stroke swallow screen 5. Telemetry monitoring 6. Frequent neuro checks 7. Aggressive lipid management with target LDL<70. 8. BP management 9. Smoking cessation counseling   Alexis Goodell, MD Neurology [phone] 02/07/2018, 11:45 AM

## 2018-02-07 NOTE — Progress Notes (Signed)
Pt accepted in transfer from 1 - c. He is alert, oriented. Painfree. Tele nsr 80's. Wife at bedside. Amiodarone bolus of 150 mg infused over 10 minutes. Pt tolerated it well. (see vs.)

## 2018-02-07 NOTE — Progress Notes (Signed)
OT Screen Note  Patient Details Name: KOHEI ANTONELLIS MRN: [id number] DOB: 1956-07-26   Cancelled Treatment:    Reason Eval/Treat Not Completed: OT screened, no needs identified, will sign off. Order received, chart reviewed. Per PT, SLP, and RN, pt back to baseline independence, no functional deficits or impairments at this time. No skilled OT needs. Will sign off. Please re-consult if additional needs arise.   Jeni Salles, MPH, MS, OTR/L ascom [phone] 02/07/18, 12:15 PM

## 2018-02-07 NOTE — Progress Notes (Signed)
PT Cancellation Note  Patient Details Name: Alex Banks MRN: [id number] DOB: May 08, 1956   Cancelled Treatment:    Reason Eval/Treat Not Completed: PT screened, no needs identified, will sign off.  Per RN the pt is independently ambulating in room without AD and does not demonstrate any unsteadiness or weakness.  PT will sign off at this time.     Collie Siad PT, DPT 02/07/2018, 11:48 AM

## 2018-02-07 NOTE — Progress Notes (Signed)
Family Meeting Note  Advance Directive:yes  Today a meeting took place with the Patient.    The following clinical team members were present during this meeting:MD  The following were discussed:Patient's diagnosis: TIA, hypertension uncontrolled, nonsustained V. tach, history of prostate cancer, treatment plan of care discussed in detail with the patient.  He verbalized understanding of the plan  patient's progosis: > 12 months and Goals for treatment: Full Code , wife HCPOA  Additional follow-up to be provided: Hospitalist, neurology and cardiology  Time spent during discussion:17 MIN  Alex Mango, MD

## 2018-02-08 LAB — BASIC METABOLIC PANEL
ANION GAP: 6 (ref 5–15)
BUN: 18 mg/dL (ref 6–20)
CO2: 24 mmol/L (ref 22–32)
Calcium: 8.7 mg/dL — ABNORMAL LOW (ref 8.9–10.3)
Chloride: 104 mmol/L (ref 101–111)
Creatinine, Ser: 0.78 mg/dL (ref 0.61–1.24)
GFR calc Af Amer: >60 mL/min (ref >60)
Glucose, Bld: 95 mg/dL (ref 65–99)
POTASSIUM: 3.4 mmol/L — AB (ref 3.5–5.1)
Sodium: 134 mmol/L — ABNORMAL LOW (ref 135–145)

## 2018-02-08 LAB — ECHOCARDIOGRAM COMPLETE
HEIGHTINCHES: 70 in
WEIGHTICAEL: 2979.2 [oz_av]

## 2018-02-08 MED ORDER — LISINOPRIL 40 MG PO TABS: 40.0000 mg | ORAL_TABLET | Freq: Every day | ORAL | 0 refills | Status: AC

## 2018-02-08 MED ORDER — ATORVASTATIN CALCIUM 40 MG PO TABS: 40.0000 mg | ORAL_TABLET | Freq: Every day | ORAL | 0 refills | Status: AC

## 2018-02-08 MED ORDER — METOPROLOL TARTRATE 25 MG PO TABS: 25.0000 mg | ORAL_TABLET | Freq: Two times a day (BID) | ORAL | 0 refills | Status: AC

## 2018-02-08 MED ORDER — POTASSIUM CHLORIDE CRYS ER 20 MEQ PO TBCR
40.0000 meq | EXTENDED_RELEASE_TABLET | ORAL | Status: AC
Start: 2018-02-08 — End: 2018-02-08
  Administered 2018-02-08 (×2): 40 meq via ORAL
  Filled 2018-02-08: qty 2

## 2018-02-08 MED ORDER — LISINOPRIL 20 MG PO TABS
20.0000 mg | ORAL_TABLET | Freq: Every day | ORAL | Status: DC
  Administered 2018-02-08: 20 mg via ORAL

## 2018-02-08 MED ORDER — NICOTINE 14 MG/24HR TD PT24: 14.0000 mg | MEDICATED_PATCH | Freq: Every day | TRANSDERMAL | 0 refills | Status: DC

## 2018-02-08 MED ORDER — CLOPIDOGREL BISULFATE 75 MG PO TABS: 75.0000 mg | ORAL_TABLET | Freq: Every day | ORAL | 0 refills | Status: AC

## 2018-02-08 MED ORDER — HYDROCHLOROTHIAZIDE 25 MG PO TABS: 25.0000 mg | ORAL_TABLET | Freq: Every day | ORAL | 0 refills | Status: AC

## 2018-02-08 MED ORDER — SENNOSIDES-DOCUSATE SODIUM 8.6-50 MG PO TABS: 1.0000 | ORAL_TABLET | Freq: Every evening | ORAL | Status: DC | PRN

## 2018-02-08 NOTE — Discharge Instructions (Signed)
Follow-up with primary care physician in a week  follow-up with cardiology Dr. Nehemiah Massed in 1 week Follow-up with neurology in 3 to 4 weeks

## 2018-02-08 NOTE — Consult Note (Signed)
MEDICATION RELATED CONSULT NOTE -  Pharmacy Consult for electrolytes Indication: hypokalemia  No Known Allergies  Patient Measurements: Height: 5\' 10"  (177.8 cm) Weight: 186 lb 3.2 oz (84.5 kg) IBW/kg (Calculated) : 73    Vital Signs: Temp: 98.2 F (36.8 C) (04/21 0800) Temp Source: Oral (04/21 0800) BP: 165/91 (04/21 0800) Pulse Rate: 88 (04/21 0800) Intake/Output from previous day: 04/20 0701 - 04/21 0700 In: 240 [P.O.:240] Out: 200 [Urine:200] Intake/Output from this shift: No intake/output data recorded.  Labs: Recent Labs    02/06/18 1411 02/08/18 0509  WBC 12.8*  --   HGB 17.2  --   HCT 49.7  --   PLT 217  --   APTT 27  --   CREATININE 0.87 0.78  MG 2.1  --   ALBUMIN 5.0  --   PROT 8.5*  --   AST 22  --   ALT 24  --   ALKPHOS 69  --   BILITOT 0.8  --    Estimated Creatinine Clearance: 98.9 mL/min (by C-G formula based on SCr of 0.78 mg/dL).   Assessment: Pt is a 62 year old male admitted with dizziness/double vision. CT did not show stroke, but pt admitted for further workup. K on admission on 4/19 was 3.2, Mg =2.1. Pt received 40 po KCL last night and another 40 po this AM after labs were drawn.  Goal of Therapy:  Normalization of electrolytes Targeting K ~4  Plan:  K 3.4 - MD has already ordered KCl 40 mEq PO q4h x2 doses Recheck BMET and Mag in AM  Rayna Sexton, PharmD, BCPS Clinical Pharmacist 02/08/2018 11:37 AM

## 2018-02-08 NOTE — Consult Note (Signed)
[facility] Consultation Note  Patient ID: Alex Banks, MRN: [id number], DOB/AGE: Feb 25, 1956 62 y.o. Admit date: 02/06/2018   Date of Consult: 02/08/2018 Primary Physician: Wenda Low, MD Primary Cardiologist: None  Chief Complaint:  Chief Complaint  Patient presents with  . Diplopia   Reason for Consult: Supraventricular tachycardia  HPI: 61 y.o. male with known peripheral vascular disease status post recent TIA and MRI showing significant stenosis of carotid arteries as well as frontal lobe anterior cerebral artery.  The patient was admitted for episode of diplopia which improved over 24-hour.  Consistent with TIA.  The patient since has been on appropriate medication management for hypertension control as well as lipid management and aspirin.  The patient has been feeling well and has had no evidence of angina or congestive heart failure throughout his last many months as well as during his hospitalization.  Yesterday he had an asymptomatic run of 15 beats of supraventricular tachycardia of unknown etiology.  This did not appear to be true atrial fibrillation.  He has not had any previous history of palpitations.  This has been helped by metoprolol use and is asymptomatic at this time.  The majority of his other cardiovascular concerns stems from his significant tobacco abuse for which we have counseled him on the discontinuation of tobacco abuse at this time.  He has been ambulating well today  Past Medical History:  Diagnosis Date  . Hypertension   . Prostate cancer [facility])       Surgical History:  Past Surgical History:  Procedure Laterality Date  . PROSTATE BIOPSY       Home Meds: Prior to Admission medications   Medication Sig Start Date End Date Taking? Authorizing Provider  amLODipine (NORVASC) 10 MG tablet Take 10 mg by mouth daily.   Yes [provider]  aspirin EC 81 MG tablet Take 81 mg by mouth daily.   Yes [provider]   atorvastatin (LIPITOR) 20 MG tablet Take 20 mg by mouth daily.   Yes [provider]  lisinopril-hydrochlorothiazide (PRINZIDE,ZESTORETIC) 20-25 MG tablet Take 1 tablet by mouth daily.   Yes [provider]    Inpatient Medications:  . amLODipine  10 mg Oral Daily  . aspirin EC  81 mg Oral Daily  . atorvastatin  40 mg Oral q1800  . clopidogrel  75 mg Oral Daily  . enoxaparin (LOVENOX) injection  40 mg Subcutaneous Q24H  . lisinopril  20 mg Oral Daily   And  . hydrochlorothiazide  25 mg Oral Daily  . lisinopril  20 mg Oral Daily  . metoprolol tartrate  25 mg Oral BID  . nicotine  14 mg Transdermal Daily  . potassium chloride  40 mEq Oral Q4H     Allergies: No Known Allergies  Social History   Socioeconomic History  . Marital status: Married    Spouse name: Not on file  . Number of children: Not on file  . Years of education: Not on file  . Highest education level: Not on file  Occupational History    Comment: manager  Social Needs  . Financial resource strain: Not on file  . Food insecurity:    Worry: Not on file    Inability: Not on file  . Transportation needs:    Medical: Not on file    Non-medical: Not on file  Tobacco Use  . Smoking status: Light Tobacco Smoker    Packs/day: 0.10    Years: 44.00  Pack years: 4.40    Types: Cigarettes  . Smokeless tobacco: Never Used  Substance and Sexual Activity  . Alcohol use: No  . Drug use: No  . Sexual activity: Not on file  Lifestyle  . Physical activity:    Days per week: Not on file    Minutes per session: Not on file  . Stress: Not on file  Relationships  . Social connections:    Talks on phone: Not on file    Gets together: Not on file    Attends religious service: Not on file    Active member of club or organization: Not on file    Attends meetings of clubs or organizations: Not on file    Relationship status: Not on file  . Intimate partner violence:    Fear of current or ex  partner: Not on file    Emotionally abused: Not on file    Physically abused: Not on file    Forced sexual activity: Not on file  Other Topics Concern  . Not on file  Social History Narrative   Resides in [place]. One grown son in [place] and the other in [place]. No grandchildren.     Family History  Problem Relation Age of Onset  . Cancer Mother        liver  . Cancer Brother        bone     Review of Systems Positive for neurologic abnormality Negative for: General:  chills, fever, night sweats or weight changes.  Cardiovascular: PND orthopnea syncope dizziness  Dermatological skin lesions rashes Respiratory: Cough congestion Urologic: Frequent urination urination at night and hematuria Abdominal: negative for nausea, vomiting, diarrhea, bright red blood per rectum, melena, or hematemesis Neurologic: negative for visual changes, and/or hearing changes after admission All other systems reviewed and are otherwise negative except as noted above.  Labs: Recent Labs    02/06/18 1411  TROPONINI <0.03   Lab Results  Component Value Date   WBC 12.8 (H) 02/06/2018   HGB 17.2 02/06/2018   HCT 49.7 02/06/2018   MCV 88.1 02/06/2018   PLT 217 02/06/2018    Recent Labs  Lab 02/06/18 1411  02/08/18 0509  NA 135  --  134*  K 3.2*   < > 3.4*  CL 99*  --  104  CO2 26  --  24  BUN 15  --  18  CREATININE 0.87  --  0.78  CALCIUM 9.6  --  8.7*  PROT 8.5*  --   --   BILITOT 0.8  --   --   ALKPHOS 69  --   --   ALT 24  --   --   AST 22  --   --   GLUCOSE 119*  --  95   < > = values in this interval not displayed.   Lab Results  Component Value Date   CHOL 181 02/07/2018   HDL 39 (L) 02/07/2018   LDLCALC 112 (H) 02/07/2018   TRIG 149 02/07/2018   No results found for: DDIMER  Radiology/Studies:  Ct Angio Head W Or Wo Contrast  Result Date: 02/06/2018 CLINICAL DATA:  Diplopia and dizziness. Third nerve palsy. Concern for posterior circulation/brainstem  infarct. EXAM: CT ANGIOGRAPHY HEAD AND NECK CT PERFUSION BRAIN TECHNIQUE: Multidetector CT imaging of the head and neck was performed using the standard protocol during bolus administration of intravenous contrast. Multiplanar CT image reconstructions and MIPs were obtained to evaluate the vascular anatomy.  Carotid stenosis measurements (when applicable) are obtained utilizing NASCET criteria, using the distal internal carotid diameter as the denominator. Multiphase CT imaging of the brain was performed following IV bolus contrast injection. Subsequent parametric perfusion maps were calculated using RAPID software. CONTRAST:  169mL ISOVUE-370 IOPAMIDOL (ISOVUE-370) INJECTION 76% COMPARISON:  None. FINDINGS: CT HEAD FINDINGS Brain: No acute large territory infarct, intracranial hemorrhage, mass, midline shift, or extra-axial fluid collection is identified. 9 mm hypodensity in the ventral right thalamus has a chronic appearance on coronal and sagittal reformats. Scattered subcortical and periventricular white matter hypodensities are nonspecific but compatible with mild chronic small vessel ischemic disease. The ventricles and sulci are normal. Vascular: Calcified atherosclerosis at the skull base. Skull: No fracture. Nonspecific 2 cm lucency in the right frontal bone. Sinuses/Orbits: Visualized paranasal sinuses and mastoid air cells are clear. Visualized orbits are unremarkable. Other: None. ASPECTS [facility] Stroke Program Early CT Score) Not scored due to symptoms suggesting a posterior fossa infarct. CTA NECK FINDINGS Aortic arch: Standard 3 vessel aortic arch. Moderate aortic atherosclerosis. Widely patent arch vessel origins. Right carotid system: Patent with calcified and soft plaque at the carotid bifurcation resulting in 50% proximal ICA stenosis. Left carotid system: Patent with calcified plaque at the carotid bifurcation resulting in 40% proximal ICA stenosis. Vertebral arteries: Both vertebral arteries are  patent with the left being strongly dominant. There is scattered nonstenotic left vertebral artery plaque. The right vertebral artery is diffusely hypoplastic without significant focal stenosis. Skeleton: Lower cervical disc degeneration, severe at C6-7. Other neck: No mass or enlarged lymph nodes. Upper chest: Grossly clear lung apices allowing for motion artifact. Review of the MIP images confirms the above findings CTA HEAD FINDINGS Anterior circulation: The internal carotid arteries are patent from skull base to carotid termini with siphon atherosclerosis bilaterally. There is mild-to-moderate focal proximal right supraclinoid stenosis (series 13, image 240). MCAs are patent without evidence of proximal branch occlusion or flow limiting proximal stenosis. The ACAs are patent without significant right A1 stenosis. The left A1 segment is diffusely small and irregular with superimposed severe focal narrowing including of its origin. No aneurysm or vascular malformation. Posterior circulation: The intracranial left vertebral artery is patent with mild atherosclerotic irregularity but no stenosis. The intracranial right vertebral artery is diminutive, particularly distal to the PICA origin with full patency to the vertebrobasilar junction difficult to confirm due to its small size resulting in poor visualization of the distal most right V4 segment. Patent PICA origins are identified bilaterally, with the left PICA appearing dominant. The right PICA is small and poorly visualized. The SCAs are small and poorly evaluated, particularly on the left. The basilar artery is patent with mild diffuse irregularity but no stenosis. There is likely a diminutive right posterior communicating artery. The PCAs are patent with branch vessel irregularity but no evidence of significant proximal stenosis. No aneurysm or vascular malformation. Venous sinuses: Patent. Anatomic variants: None. Delayed phase: No abnormal enhancement.  Review of the MIP images confirms the above findings CT Brain Perfusion Findings: CBF (<30%) Volume: 40mL Perfusion (Tmax>6.0s) volume: 24mL, located in the anteromedial left frontal lobe (ACA territory) and right temporoparietal white matter Mismatch Volume: 48mL Infarction Location: None. Preserved cerebral blood flow and blood volume in the above-mentioned areas with delayed perfusion. IMPRESSION: 1. No intracranial hemorrhage or acute infarct evident on CT. 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 2. No emergent large vessel occlusion. 3. Strongly dominant left vertebral artery  without stenosis. 4. Diffusely hypoplastic right vertebral artery, poorly visualized distal to the PICA origin. 5. Bilateral proximal ICA stenoses, 50% on the right and 40% on the left. 6. Severe left A1 stenoses with mildly delayed perfusion in the medial left frontal lobe but no evidence of core infarct. 7. Mild-to-moderate right supraclinoid ICA stenosis. 8.  Aortic Atherosclerosis (ICD10-I70.0). These results were called by telephone at the time of interpretation on 02/06/2018 at 2:55 pm to Dr. Marjean Donna , who verbally acknowledged these results. Electronically Signed   By: Logan Bores M.D.   On: 02/06/2018 15:29   Ct Angio Neck W And/or Wo Contrast  Result Date: 02/06/2018 CLINICAL DATA:  Diplopia and dizziness. Third nerve palsy. Concern for posterior circulation/brainstem infarct. EXAM: CT ANGIOGRAPHY HEAD AND NECK CT PERFUSION BRAIN TECHNIQUE: Multidetector CT imaging of the head and neck was performed using the standard protocol during bolus administration of intravenous contrast. Multiplanar CT image reconstructions and MIPs were obtained to evaluate the vascular anatomy. Carotid stenosis measurements (when applicable) are obtained utilizing NASCET criteria, using the distal internal carotid diameter as the denominator. Multiphase CT imaging of the brain was  performed following IV bolus contrast injection. Subsequent parametric perfusion maps were calculated using RAPID software. CONTRAST:  176mL ISOVUE-370 IOPAMIDOL (ISOVUE-370) INJECTION 76% COMPARISON:  None. FINDINGS: CT HEAD FINDINGS Brain: No acute large territory infarct, intracranial hemorrhage, mass, midline shift, or extra-axial fluid collection is identified. 9 mm hypodensity in the ventral right thalamus has a chronic appearance on coronal and sagittal reformats. Scattered subcortical and periventricular white matter hypodensities are nonspecific but compatible with mild chronic small vessel ischemic disease. The ventricles and sulci are normal. Vascular: Calcified atherosclerosis at the skull base. Skull: No fracture. Nonspecific 2 cm lucency in the right frontal bone. Sinuses/Orbits: Visualized paranasal sinuses and mastoid air cells are clear. Visualized orbits are unremarkable. Other: None. ASPECTS [facility] Stroke Program Early CT Score) Not scored due to symptoms suggesting a posterior fossa infarct. CTA NECK FINDINGS Aortic arch: Standard 3 vessel aortic arch. Moderate aortic atherosclerosis. Widely patent arch vessel origins. Right carotid system: Patent with calcified and soft plaque at the carotid bifurcation resulting in 50% proximal ICA stenosis. Left carotid system: Patent with calcified plaque at the carotid bifurcation resulting in 40% proximal ICA stenosis. Vertebral arteries: Both vertebral arteries are patent with the left being strongly dominant. There is scattered nonstenotic left vertebral artery plaque. The right vertebral artery is diffusely hypoplastic without significant focal stenosis. Skeleton: Lower cervical disc degeneration, severe at C6-7. Other neck: No mass or enlarged lymph nodes. Upper chest: Grossly clear lung apices allowing for motion artifact. Review of the MIP images confirms the above findings CTA HEAD FINDINGS Anterior circulation: The internal carotid arteries are  patent from skull base to carotid termini with siphon atherosclerosis bilaterally. There is mild-to-moderate focal proximal right supraclinoid stenosis (series 13, image 240). MCAs are patent without evidence of proximal branch occlusion or flow limiting proximal stenosis. The ACAs are patent without significant right A1 stenosis. The left A1 segment is diffusely small and irregular with superimposed severe focal narrowing including of its origin. No aneurysm or vascular malformation. Posterior circulation: The intracranial left vertebral artery is patent with mild atherosclerotic irregularity but no stenosis. The intracranial right vertebral artery is diminutive, particularly distal to the PICA origin with full patency to the vertebrobasilar junction difficult to confirm due to its small size resulting in poor visualization of the distal most right V4 segment. Patent PICA origins are identified bilaterally,  with the left PICA appearing dominant. The right PICA is small and poorly visualized. The SCAs are small and poorly evaluated, particularly on the left. The basilar artery is patent with mild diffuse irregularity but no stenosis. There is likely a diminutive right posterior communicating artery. The PCAs are patent with branch vessel irregularity but no evidence of significant proximal stenosis. No aneurysm or vascular malformation. Venous sinuses: Patent. Anatomic variants: None. Delayed phase: No abnormal enhancement. Review of the MIP images confirms the above findings CT Brain Perfusion Findings: CBF (<30%) Volume: 77mL Perfusion (Tmax>6.0s) volume: 1mL, located in the anteromedial left frontal lobe (ACA territory) and right temporoparietal white matter Mismatch Volume: 74mL Infarction Location: None. Preserved cerebral blood flow and blood volume in the above-mentioned areas with delayed perfusion. IMPRESSION: 1. No intracranial hemorrhage or acute infarct evident on CT. Mild chronic small vessel ischemic  disease and a likely chronic lacunar infarct in the right thalamus. 2. No emergent large vessel occlusion. 3. Strongly dominant left vertebral artery without stenosis. 4. Diffusely hypoplastic right vertebral artery, poorly visualized distal to the PICA origin. 5. Bilateral proximal ICA stenoses, 50% on the right and 40% on the left. 6. Severe left A1 stenoses with mildly delayed perfusion in the medial left frontal lobe but no evidence of core infarct. 7. Mild-to-moderate right supraclinoid ICA stenosis. 8.  Aortic Atherosclerosis (ICD10-I70.0). These results were called by telephone at the time of interpretation on 02/06/2018 at 2:55 pm to Dr. Marjean Donna , who verbally acknowledged these results. Electronically Signed   By: Logan Bores M.D.   On: 02/06/2018 15:29   Mr Brain Wo Contrast  Result Date: 02/06/2018 CLINICAL DATA:  Dizziness and double vision. History of prostate cancer and hypertension. EXAM: MRI HEAD WITHOUT CONTRAST TECHNIQUE: Multiplanar, multiecho pulse sequences of the brain and surrounding structures were obtained without intravenous contrast. COMPARISON:  Head CT 02/06/2018 FINDINGS: BRAIN: The midline structures are normal. There is no acute infarct or acute hemorrhage. No mass lesion, hydrocephalus, dural abnormality or extra-axial collection. Early confluent hyperintense T2-weighted signal of the periventricular and deep white matter, most commonly due to chronic ischemic microangiopathy. Old right basal ganglia and right thalamus lacunar infarcts. No age-advanced or lobar predominant atrophy. No chronic microhemorrhage or superficial siderosis. VASCULAR: Major intracranial arterial and venous sinus flow voids are preserved. SKULL AND UPPER CERVICAL SPINE: The visualized skull base, calvarium, upper cervical spine and extracranial soft tissues are normal. SINUSES/ORBITS: No fluid levels or advanced mucosal thickening. No mastoid or middle ear effusion. Normal orbits. IMPRESSION: 1. No  acute intracranial abnormality. 2. Chronic ischemic microangiopathy. Electronically Signed   By: Ulyses Jarred M.D.   On: 02/06/2018 22:18   US Carotid Bilateral (at [facility] And Ap Only)  Result Date: 02/07/2018 CLINICAL DATA:  Stroke EXAM: BILATERAL CAROTID DUPLEX ULTRASOUND TECHNIQUE: Pearline Cables scale imaging, color Doppler and duplex ultrasound was performed of bilateral carotid and vertebral arteries in the neck. COMPARISON:  CTA 4 19 2019 TECHNIQUE: Quantification of carotid stenosis is based on velocity parameters that correlate the residual internal carotid diameter with NASCET-based stenosis levels, using the diameter of the distal internal carotid lumen as the denominator for stenosis measurement. The following velocity measurements were obtained: PEAK SYSTOLIC/END DIASTOLIC RIGHT ICA:                     143/40cm/sec CCA:                     95/18AC/ZYS SYSTOLIC ICA/CCA RATIO:  1.9 ECA:                     158cm/sec LEFT ICA:                     102/32cm/sec CCA:                     97/67HA/LPF SYSTOLIC ICA/CCA RATIO:  1.1 ECA:                     184cm/sec FINDINGS: RIGHT CAROTID ARTERY: Scattered nonocclusive plaque through the common carotid artery. Heavier eccentric somewhat irregular plaque in the carotid bulb resulting in at least mild stenosis. Normal waveforms and color Doppler signal. RIGHT VERTEBRAL ARTERY:  Normal flow direction and waveform. LEFT CAROTID ARTERY: Scattered nonocclusive plaque through the common carotid artery and bulb. Eccentric plaque in the proximal ICA resulting in at least mild stenosis. Normal waveforms and color Doppler signal. A nonspecific cardiac arrhythmia noted. LEFT VERTEBRAL ARTERY: Normal flow direction and waveform. IMPRESSION: 1. Bilateral carotid bifurcation and proximal ICA plaque, resulting in less than 50% diameter stenosis on the left, 50-69% diameter stenosis on the right. 2.  Antegrade bilateral vertebral arterial flow. Electronically Signed   By: Lucrezia Europe  M.D.   On: 02/07/2018 09:20   Ct Cerebral Perfusion W Contrast  Result Date: 02/06/2018 CLINICAL DATA:  Diplopia and dizziness. Third nerve palsy. Concern for posterior circulation/brainstem infarct. EXAM: CT ANGIOGRAPHY HEAD AND NECK CT PERFUSION BRAIN TECHNIQUE: Multidetector CT imaging of the head and neck was performed using the standard protocol during bolus administration of intravenous contrast. Multiplanar CT image reconstructions and MIPs were obtained to evaluate the vascular anatomy. Carotid stenosis measurements (when applicable) are obtained utilizing NASCET criteria, using the distal internal carotid diameter as the denominator. Multiphase CT imaging of the brain was performed following IV bolus contrast injection. Subsequent parametric perfusion maps were calculated using RAPID software. CONTRAST:  130mL ISOVUE-370 IOPAMIDOL (ISOVUE-370) INJECTION 76% COMPARISON:  None. FINDINGS: CT HEAD FINDINGS Brain: No acute large territory infarct, intracranial hemorrhage, mass, midline shift, or extra-axial fluid collection is identified. 9 mm hypodensity in the ventral right thalamus has a chronic appearance on coronal and sagittal reformats. Scattered subcortical and periventricular white matter hypodensities are nonspecific but compatible with mild chronic small vessel ischemic disease. The ventricles and sulci are normal. Vascular: Calcified atherosclerosis at the skull base. Skull: No fracture. Nonspecific 2 cm lucency in the right frontal bone. Sinuses/Orbits: Visualized paranasal sinuses and mastoid air cells are clear. Visualized orbits are unremarkable. Other: None. ASPECTS [facility] Stroke Program Early CT Score) Not scored due to symptoms suggesting a posterior fossa infarct. CTA NECK FINDINGS Aortic arch: Standard 3 vessel aortic arch. Moderate aortic atherosclerosis. Widely patent arch vessel origins. Right carotid system: Patent with calcified and soft plaque at the carotid bifurcation resulting  in 50% proximal ICA stenosis. Left carotid system: Patent with calcified plaque at the carotid bifurcation resulting in 40% proximal ICA stenosis. Vertebral arteries: Both vertebral arteries are patent with the left being strongly dominant. There is scattered nonstenotic left vertebral artery plaque. The right vertebral artery is diffusely hypoplastic without significant focal stenosis. Skeleton: Lower cervical disc degeneration, severe at C6-7. Other neck: No mass or enlarged lymph nodes. Upper chest: Grossly clear lung apices allowing for motion artifact. Review of the MIP images confirms the above findings CTA HEAD FINDINGS Anterior circulation: The internal carotid arteries are patent from skull base to carotid  termini with siphon atherosclerosis bilaterally. There is mild-to-moderate focal proximal right supraclinoid stenosis (series 13, image 240). MCAs are patent without evidence of proximal branch occlusion or flow limiting proximal stenosis. The ACAs are patent without significant right A1 stenosis. The left A1 segment is diffusely small and irregular with superimposed severe focal narrowing including of its origin. No aneurysm or vascular malformation. Posterior circulation: The intracranial left vertebral artery is patent with mild atherosclerotic irregularity but no stenosis. The intracranial right vertebral artery is diminutive, particularly distal to the PICA origin with full patency to the vertebrobasilar junction difficult to confirm due to its small size resulting in poor visualization of the distal most right V4 segment. Patent PICA origins are identified bilaterally, with the left PICA appearing dominant. The right PICA is small and poorly visualized. The SCAs are small and poorly evaluated, particularly on the left. The basilar artery is patent with mild diffuse irregularity but no stenosis. There is likely a diminutive right posterior communicating artery. The PCAs are patent with branch vessel  irregularity but no evidence of significant proximal stenosis. No aneurysm or vascular malformation. Venous sinuses: Patent. Anatomic variants: None. Delayed phase: No abnormal enhancement. Review of the MIP images confirms the above findings CT Brain Perfusion Findings: CBF (<30%) Volume: 55mL Perfusion (Tmax>6.0s) volume: 47mL, located in the anteromedial left frontal lobe (ACA territory) and right temporoparietal white matter Mismatch Volume: 63mL Infarction Location: None. Preserved cerebral blood flow and blood volume in the above-mentioned areas with delayed perfusion. IMPRESSION: 1. No intracranial hemorrhage or acute infarct evident on CT. 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 2. No emergent large vessel occlusion. 3. Strongly dominant left vertebral artery without stenosis. 4. Diffusely hypoplastic right vertebral artery, poorly visualized distal to the PICA origin. 5. Bilateral proximal ICA stenoses, 50% on the right and 40% on the left. 6. Severe left A1 stenoses with mildly delayed perfusion in the medial left frontal lobe but no evidence of core infarct. 7. Mild-to-moderate right supraclinoid ICA stenosis. 8.  Aortic Atherosclerosis (ICD10-I70.0). These results were called by telephone at the time of interpretation on 02/06/2018 at 2:55 pm to Dr. Marjean Donna , who verbally acknowledged these results. Electronically Signed   By: Logan Bores M.D.   On: 02/06/2018 15:29   Ct Head Code Stroke Wo Contrast  Result Date: 02/06/2018 CLINICAL DATA:  Diplopia and dizziness. Third nerve palsy. Concern for posterior circulation/brainstem infarct. EXAM: CT ANGIOGRAPHY HEAD AND NECK CT PERFUSION BRAIN TECHNIQUE: Multidetector CT imaging of the head and neck was performed using the standard protocol during bolus administration of intravenous contrast. Multiplanar CT image reconstructions and MIPs were obtained to evaluate the vascular anatomy.  Carotid stenosis measurements (when applicable) are obtained utilizing NASCET criteria, using the distal internal carotid diameter as the denominator. Multiphase CT imaging of the brain was performed following IV bolus contrast injection. Subsequent parametric perfusion maps were calculated using RAPID software. CONTRAST:  167mL ISOVUE-370 IOPAMIDOL (ISOVUE-370) INJECTION 76% COMPARISON:  None. FINDINGS: CT HEAD FINDINGS Brain: No acute large territory infarct, intracranial hemorrhage, mass, midline shift, or extra-axial fluid collection is identified. 9 mm hypodensity in the ventral right thalamus has a chronic appearance on coronal and sagittal reformats. Scattered subcortical and periventricular white matter hypodensities are nonspecific but compatible with mild chronic small vessel ischemic disease. The ventricles and sulci are normal. Vascular: Calcified atherosclerosis at the skull base. Skull: No fracture. Nonspecific 2 cm lucency in the right frontal bone.  Sinuses/Orbits: Visualized paranasal sinuses and mastoid air cells are clear. Visualized orbits are unremarkable. Other: None. ASPECTS [facility] Stroke Program Early CT Score) Not scored due to symptoms suggesting a posterior fossa infarct. CTA NECK FINDINGS Aortic arch: Standard 3 vessel aortic arch. Moderate aortic atherosclerosis. Widely patent arch vessel origins. Right carotid system: Patent with calcified and soft plaque at the carotid bifurcation resulting in 50% proximal ICA stenosis. Left carotid system: Patent with calcified plaque at the carotid bifurcation resulting in 40% proximal ICA stenosis. Vertebral arteries: Both vertebral arteries are patent with the left being strongly dominant. There is scattered nonstenotic left vertebral artery plaque. The right vertebral artery is diffusely hypoplastic without significant focal stenosis. Skeleton: Lower cervical disc degeneration, severe at C6-7. Other neck: No mass or enlarged lymph nodes. Upper  chest: Grossly clear lung apices allowing for motion artifact. Review of the MIP images confirms the above findings CTA HEAD FINDINGS Anterior circulation: The internal carotid arteries are patent from skull base to carotid termini with siphon atherosclerosis bilaterally. There is mild-to-moderate focal proximal right supraclinoid stenosis (series 13, image 240). MCAs are patent without evidence of proximal branch occlusion or flow limiting proximal stenosis. The ACAs are patent without significant right A1 stenosis. The left A1 segment is diffusely small and irregular with superimposed severe focal narrowing including of its origin. No aneurysm or vascular malformation. Posterior circulation: The intracranial left vertebral artery is patent with mild atherosclerotic irregularity but no stenosis. The intracranial right vertebral artery is diminutive, particularly distal to the PICA origin with full patency to the vertebrobasilar junction difficult to confirm due to its small size resulting in poor visualization of the distal most right V4 segment. Patent PICA origins are identified bilaterally, with the left PICA appearing dominant. The right PICA is small and poorly visualized. The SCAs are small and poorly evaluated, particularly on the left. The basilar artery is patent with mild diffuse irregularity but no stenosis. There is likely a diminutive right posterior communicating artery. The PCAs are patent with branch vessel irregularity but no evidence of significant proximal stenosis. No aneurysm or vascular malformation. Venous sinuses: Patent. Anatomic variants: None. Delayed phase: No abnormal enhancement. Review of the MIP images confirms the above findings CT Brain Perfusion Findings: CBF (<30%) Volume: 10mL Perfusion (Tmax>6.0s) volume: 1mL, located in the anteromedial left frontal lobe (ACA territory) and right temporoparietal white matter Mismatch Volume: 65mL Infarction Location: None. Preserved cerebral  blood flow and blood volume in the above-mentioned areas with delayed perfusion. IMPRESSION: 1. No intracranial hemorrhage or acute infarct evident on CT. 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 2. No emergent large vessel occlusion. 3. Strongly dominant left vertebral artery without stenosis. 4. Diffusely hypoplastic right vertebral artery, poorly visualized distal to the PICA origin. 5. Bilateral proximal ICA stenoses, 50% on the right and 40% on the left. 6. Severe left A1 stenoses with mildly delayed perfusion in the medial left frontal lobe but no evidence of core infarct. 7. Mild-to-moderate right supraclinoid ICA stenosis. 8.  Aortic Atherosclerosis (ICD10-I70.0). These results were called by telephone at the time of interpretation on 02/06/2018 at 2:55 pm to Dr. Marjean Donna , who verbally acknowledged these results. Electronically Signed   By: Logan Bores M.D.   On: 02/06/2018 15:29    EKG: Normal sinus rhythm  Weights: Filed Weights   02/06/18 1407 02/06/18 1641 02/07/18 0500  Weight: 187 lb (84.8 kg) 184 lb 8 oz (83.7 kg) 186  lb 3.2 oz (84.5 kg)     Physical Exam: Blood pressure (!) 156/86, pulse 77, temperature 98.3 F (36.8 C), resp. rate 16, height 5\' 10"  (1.778 m), weight 186 lb 3.2 oz (84.5 kg), SpO2 99 %. Body mass index is 26.72 kg/m. General: Well developed, well nourished, in no acute distress. Head eyes ears nose throat: Normocephalic, atraumatic, sclera non-icteric, no xanthomas, nares are without discharge. No apparent thyromegaly and/or mass  Lungs: Normal respiratory effort.  no wheezes, no rales, no rhonchi.  Heart: RRR with normal S1 S2. no murmur gallop, no rub, PMI is normal size and placement, carotid upstroke normal without bruit, jugular venous pressure is normal Abdomen: Soft, non-tender, non-distended with normoactive bowel sounds. No hepatomegaly. No rebound/guarding. No obvious abdominal masses.  Abdominal aorta is normal size without bruit Extremities: No edema. no cyanosis, no clubbing, no ulcers  Peripheral : 2+ bilateral upper extremity pulses, 2+ bilateral femoral pulses, 2+ bilateral dorsal pedal pulse Neuro: Alert and oriented. No facial asymmetry. No focal deficit. Moves all extremities spontaneously. Musculoskeletal: Normal muscle tone without kyphosis Psych:  Responds to questions appropriately with a normal affect.    Assessment: 62 year old male with significant tobacco abuse essential hypertension mixed hyperlipidemia and peripheral vascular disease with a TIA completely resolved on appropriate medication management with an incidental finding of short run of asymptomatic supraventricular tachycardia now stable  Plan: 1.  Continue for abstinence of tobacco abuse due to its significant hazards 2.  Continue aspirin for further risk reduction cardiovascular event 3.  High intensity cholesterol therapy with atorvastatin 4.  Hypertension control with amlodipine beta-blocker ACE inhibitor for goal systolic blood pressure below 130 mm 5.  Continue metoprolol for heart rate control of incidental supraventricular tachycardia 6.  Outpatient evaluation for further evaluation of cardiovascular disease with a stress test due to no symptoms at this time of true angina  Signed, Corey Skains M.D. [facility] 02/08/2018, 1:09 PM

## 2018-02-08 NOTE — Progress Notes (Signed)
[place], [place].   02/08/2018  Patient: Alex Banks   Date of Birth:  Nov 06, 1955  Date of admission:  02/06/2018  Date of Discharge  02/08/2018    To Whom it May Concern:   Alex Banks  may return to work on 02/11/18.  PHYSICAL ACTIVITY:  Full  If you have any questions or concerns, please don't hesitate to call.  Sincerely,   Nicholes Mango M.D Pager [phone] Office : [phone]   .

## 2018-02-08 NOTE — Discharge Summary (Signed)
[place] [facility] [place] NAME: Alex Banks    MR#:  [id number]  DATE OF BIRTH:  13-Oct-1956  DATE OF ADMISSION:  02/06/2018 ADMITTING PHYSICIAN: Demetrios Loll, MD  DATE OF DISCHARGE:02/08/18  PRIMARY CARE PHYSICIAN: Wenda Low, MD    ADMISSION DIAGNOSIS:  Cerebrovascular accident (CVA), unspecified mechanism ([place]) [I63.9]  DISCHARGE DIAGNOSIS:  TIA Asymptomatic nonsustained V. Tach Hypertension-uncontrolled Tobacco abuse disorder Hyperlipidemia  SECONDARY DIAGNOSIS:   Past Medical History:  Diagnosis Date  . Hypertension   . Prostate cancer [facility])     HOSPITAL COURSE:  HPI  Alex Banks  is a 62 y.o. male with a known history of hypertension and prostate cancer.  He presented to ED with above chief complaints.  He started to have a dizziness and double vision at about 10 AM today.  He still has double vision in the ED but denies any weakness, numbness or tingling, no dysphagia or slurred speech.  His blood pressure was high at 194/114 on arrival.  Code stroke was called by the CT of the head did not show any acute stroke but Severe left A1 stenoses with mildly delayed perfusion in the medial left frontal lobe but no evidence of core infarct..  Telemetry neurologist Dr. Lucia Gaskins suggested no TPA treatment at this time but admission for further evaluation for acute 3rd nerve palsy with concerns for CVA.   TIA MRI of the brain is negative  carotid Dopplers left ICA with less than 50% stenosis in the right ICA 50 to 69% stenosis Echocardiogram -has revealed 35 to 40% ejection fraction, hypokinesis of the apical myocardium.  Hypokinesis of the anteroseptal myocardium mild regurgitation of mitral valve Patient has passed bedside swallow evaluation Seen and evaluated by neurology who is recommending better blood pressure control, to increase the Lipitor to 40 mg and prophylaxis with aspirin 81 mg and Plavix 75 mg TSH is normal,  hemoglobin A1c 5.5 Patient was seen and evaluated by PT and OT no needs identified Outpatient follow-up with the neurology after discharge   nonsustained V. Tach 12-lead EKG revealed normal sinus rhythm Echocardiogram results as documented above  magnesium is in the normal range replete potassium Amiodarone bolus 150 mg given,  patient was seen by cardiology Dr. Nehemiah Massed, okay to discharge patient from his standpoint and recommending outpatient cardiology follow-up for outpatient stress test    Hypertension uncontrolled Metoprolol 25  mg p.o. twice daily is added to the regimen will titrate as needed Continue hydrochlorothiazide 25 mg and lisinopril dose increased to 40 mg PCP to titrate as needed to bring down the systolic blood pressure at 130  Hypokalemia. Give potassium supplement   History of prostate cancer. Follow-up oncologist as outpatient.  Tobacco abuse. Smoking cessation was counseled for 4 minutes, nicotine patch.   Work excuse note given to resume work from Wednesday, April 24  DISCHARGE CONDITIONS:   STABLE  CONSULTS OBTAINED:  Treatment Team:  Alexis Goodell, MD Corey Skains, MD   PROCEDURES none  DRUG ALLERGIES:  No Known Allergies  DISCHARGE MEDICATIONS:   Allergies as of 02/08/2018   No Known Allergies     Medication List    STOP taking these medications   amLODipine 10 MG tablet Commonly known as:  NORVASC   lisinopril-hydrochlorothiazide 20-25 MG tablet Commonly known as:  PRINZIDE,ZESTORETIC     TAKE these medications   aspirin EC 81 MG tablet Take 81 mg by mouth daily.   atorvastatin 40 MG tablet Commonly  known as:  LIPITOR Take 1 tablet (40 mg total) by mouth daily at 6 PM. What changed:    medication strength  how much to take  when to take this   clopidogrel 75 MG tablet Commonly known as:  PLAVIX Take 1 tablet (75 mg total) by mouth daily. Start taking on:  02/09/2018   hydrochlorothiazide 25 MG  tablet Commonly known as:  HYDRODIURIL Take 1 tablet (25 mg total) by mouth daily. Start taking on:  02/09/2018   lisinopril 40 MG tablet Commonly known as:  PRINIVIL,ZESTRIL Take 1 tablet (40 mg total) by mouth daily. Start taking on:  02/09/2018   metoprolol tartrate 25 MG tablet Commonly known as:  LOPRESSOR Take 1 tablet (25 mg total) by mouth 2 (two) times daily.   nicotine 14 mg/24hr patch Commonly known as:  NICODERM CQ - dosed in mg/24 hours Place 1 patch (14 mg total) onto the skin daily. Start taking on:  02/09/2018   senna-docusate 8.6-50 MG tablet Commonly known as:  Senokot-S Take 1 tablet by mouth at bedtime as needed for mild constipation.        DISCHARGE INSTRUCTIONS:   Follow-up with primary care physician in 1 week Follow-up with cardiology Dr. Nehemiah Massed in 1 week Follow-up with neurology in 1 month  DIET:  Cardiac diet  DISCHARGE CONDITION:  Stable  ACTIVITY:  Activity as tolerated  OXYGEN:  Home Oxygen: No.   Oxygen Delivery: room air  DISCHARGE LOCATION:  home   If you experience worsening of your admission symptoms, develop shortness of breath, life threatening emergency, suicidal or homicidal thoughts you must seek medical attention immediately by calling 911 or calling your MD immediately  if symptoms less severe.  You Must read complete instructions/literature along with all the possible adverse reactions/side effects for all the Medicines you take and that have been prescribed to you. Take any new Medicines after you have completely understood and accpet all the possible adverse reactions/side effects.   Please note  You were cared for by a hospitalist during your hospital stay. If you have any questions about your discharge medications or the care you received while you were in the hospital after you are discharged, you can call the unit and asked to speak with the hospitalist on call if the hospitalist that took care of you is not  available. Once you are discharged, your primary care physician will handle any further medical issues. Please note that NO REFILLS for any discharge medications will be authorized once you are discharged, as it is imperative that you return to your primary care physician (or establish a relationship with a primary care physician if you do not have one) for your aftercare needs so that they can reassess your need for medications and monitor your lab values.     Today  Chief Complaint  Patient presents with  . Diplopia   Patient is resting comfortably.  Denies any chest pain or shortness of breath.  Denies any palpitations.  Denies any diplopia or weakness, seen by cardiology Okay to discharge patient from cardiology standpoint  ROS: Patient is resting comfortably.  Denies any chest pain or shortness of breath no palpitations.  Denies any diplopia.  No weakness.  Wants to go home CONSTITUTIONAL: Denies fevers, chills. Denies any fatigue, weakness.  EYES: Denies blurry vision, double vision, eye pain. EARS, NOSE, THROAT: Denies tinnitus, ear pain, hearing loss. RESPIRATORY: Denies cough, wheeze, shortness of breath.  CARDIOVASCULAR: Denies chest pain, palpitations, edema.  GASTROINTESTINAL:  Denies nausea, vomiting, diarrhea, abdominal pain. Denies bright red blood per rectum. GENITOURINARY: Denies dysuria, hematuria. ENDOCRINE: Denies nocturia or thyroid problems. HEMATOLOGIC AND LYMPHATIC: Denies easy bruising or bleeding. SKIN: Denies rash or lesion. MUSCULOSKELETAL: Denies pain in neck, back, shoulder, knees, hips or arthritic symptoms.  NEUROLOGIC: Denies paralysis, paresthesias.  PSYCHIATRIC: Denies anxiety or depressive symptoms.   VITAL SIGNS:  Blood pressure (!) 156/86, pulse 77, temperature 98.3 F (36.8 C), resp. rate 16, height 5\' 10"  (1.778 m), weight 84.5 kg (186 lb 3.2 oz), SpO2 99 %.  I/O:    Intake/Output Summary (Last 24 hours) at 02/08/2018 1428 Last data filed at  02/08/2018 0401 Gross per 24 hour  Intake 240 ml  Output 200 ml  Net 40 ml    PHYSICAL EXAMINATION:  GENERAL:  62 y.o.-year-old patient lying in the bed with no acute distress.  EYES: Pupils equal, round, reactive to light and accommodation. No scleral icterus. Extraocular muscles intact.  HEENT: Head atraumatic, normocephalic. Oropharynx and nasopharynx clear.  NECK:  Supple, no jugular venous distention. No thyroid enlargement, no tenderness.  LUNGS: Normal breath sounds bilaterally, no wheezing, rales,rhonchi or crepitation. No use of accessory muscles of respiration.  CARDIOVASCULAR: S1, S2 normal. No murmurs, rubs, or gallops.  ABDOMEN: Soft, non-tender, non-distended. Bowel sounds present. No organomegaly or mass.  EXTREMITIES: No pedal edema, cyanosis, or clubbing.  NEUROLOGIC: Cranial nerves II through XII are intact. Muscle strength 5/5 in all extremities. Sensation intact. Gait not checked.  PSYCHIATRIC: The patient is alert and oriented x 3.  SKIN: No obvious rash, lesion, or ulcer.   DATA REVIEW:   CBC Recent Labs  Lab 02/06/18 1411  WBC 12.8*  HGB 17.2  HCT 49.7  PLT 217    Chemistries  Recent Labs  Lab 02/06/18 1411  02/08/18 0509  NA 135  --  134*  K 3.2*   < > 3.4*  CL 99*  --  104  CO2 26  --  24  GLUCOSE 119*  --  95  BUN 15  --  18  CREATININE 0.87  --  0.78  CALCIUM 9.6  --  8.7*  MG 2.1  --   --   AST 22  --   --   ALT 24  --   --   ALKPHOS 69  --   --   BILITOT 0.8  --   --    < > = values in this interval not displayed.    Cardiac Enzymes Recent Labs  Lab 02/06/18 1411  TROPONINI <0.03    Microbiology Results  No results found for this or any previous visit.  RADIOLOGY:  Ct Angio Head W Or Wo Contrast  Result Date: 02/06/2018 CLINICAL DATA:  Diplopia and dizziness. Third nerve palsy. Concern for posterior circulation/brainstem infarct. EXAM: CT ANGIOGRAPHY HEAD AND NECK CT PERFUSION BRAIN TECHNIQUE: Multidetector CT imaging of  the head and neck was performed using the standard protocol during bolus administration of intravenous contrast. Multiplanar CT image reconstructions and MIPs were obtained to evaluate the vascular anatomy. Carotid stenosis measurements (when applicable) are obtained utilizing NASCET criteria, using the distal internal carotid diameter as the denominator. Multiphase CT imaging of the brain was performed following IV bolus contrast injection. Subsequent parametric perfusion maps were calculated using RAPID software. CONTRAST:  169mL ISOVUE-370 IOPAMIDOL (ISOVUE-370) INJECTION 76% COMPARISON:  None. FINDINGS: CT HEAD FINDINGS Brain: No acute large territory infarct, intracranial hemorrhage, mass, midline shift, or extra-axial fluid collection is identified. 9 mm  hypodensity in the ventral right thalamus has a chronic appearance on coronal and sagittal reformats. Scattered subcortical and periventricular white matter hypodensities are nonspecific but compatible with mild chronic small vessel ischemic disease. The ventricles and sulci are normal. Vascular: Calcified atherosclerosis at the skull base. Skull: No fracture. Nonspecific 2 cm lucency in the right frontal bone. Sinuses/Orbits: Visualized paranasal sinuses and mastoid air cells are clear. Visualized orbits are unremarkable. Other: None. ASPECTS [facility] Stroke Program Early CT Score) Not scored due to symptoms suggesting a posterior fossa infarct. CTA NECK FINDINGS Aortic arch: Standard 3 vessel aortic arch. Moderate aortic atherosclerosis. Widely patent arch vessel origins. Right carotid system: Patent with calcified and soft plaque at the carotid bifurcation resulting in 50% proximal ICA stenosis. Left carotid system: Patent with calcified plaque at the carotid bifurcation resulting in 40% proximal ICA stenosis. Vertebral arteries: Both vertebral arteries are patent with the left being strongly dominant. There is scattered nonstenotic left vertebral artery  plaque. The right vertebral artery is diffusely hypoplastic without significant focal stenosis. Skeleton: Lower cervical disc degeneration, severe at C6-7. Other neck: No mass or enlarged lymph nodes. Upper chest: Grossly clear lung apices allowing for motion artifact. Review of the MIP images confirms the above findings CTA HEAD FINDINGS Anterior circulation: The internal carotid arteries are patent from skull base to carotid termini with siphon atherosclerosis bilaterally. There is mild-to-moderate focal proximal right supraclinoid stenosis (series 13, image 240). MCAs are patent without evidence of proximal branch occlusion or flow limiting proximal stenosis. The ACAs are patent without significant right A1 stenosis. The left A1 segment is diffusely small and irregular with superimposed severe focal narrowing including of its origin. No aneurysm or vascular malformation. Posterior circulation: The intracranial left vertebral artery is patent with mild atherosclerotic irregularity but no stenosis. The intracranial right vertebral artery is diminutive, particularly distal to the PICA origin with full patency to the vertebrobasilar junction difficult to confirm due to its small size resulting in poor visualization of the distal most right V4 segment. Patent PICA origins are identified bilaterally, with the left PICA appearing dominant. The right PICA is small and poorly visualized. The SCAs are small and poorly evaluated, particularly on the left. The basilar artery is patent with mild diffuse irregularity but no stenosis. There is likely a diminutive right posterior communicating artery. The PCAs are patent with branch vessel irregularity but no evidence of significant proximal stenosis. No aneurysm or vascular malformation. Venous sinuses: Patent. Anatomic variants: None. Delayed phase: No abnormal enhancement. Review of the MIP images confirms the above findings CT Brain Perfusion Findings: CBF (<30%) Volume: 40mL  Perfusion (Tmax>6.0s) volume: 64mL, located in the anteromedial left frontal lobe (ACA territory) and right temporoparietal white matter Mismatch Volume: 34mL Infarction Location: None. Preserved cerebral blood flow and blood volume in the above-mentioned areas with delayed perfusion. IMPRESSION: 1. No intracranial hemorrhage or acute infarct evident on CT. 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 2. No emergent large vessel occlusion. 3. Strongly dominant left vertebral artery without stenosis. 4. Diffusely hypoplastic right vertebral artery, poorly visualized distal to the PICA origin. 5. Bilateral proximal ICA stenoses, 50% on the right and 40% on the left. 6. Severe left A1 stenoses with mildly delayed perfusion in the medial left frontal lobe but no evidence of core infarct. 7. Mild-to-moderate right supraclinoid ICA stenosis. 8.  Aortic Atherosclerosis (ICD10-I70.0). These results were called by telephone at the time of interpretation on 02/06/2018 at  2:55 pm to Dr. Marjean Donna , who verbally acknowledged these results. Electronically Signed   By: Logan Bores M.D.   On: 02/06/2018 15:29   Ct Angio Neck W And/or Wo Contrast  Result Date: 02/06/2018 CLINICAL DATA:  Diplopia and dizziness. Third nerve palsy. Concern for posterior circulation/brainstem infarct. EXAM: CT ANGIOGRAPHY HEAD AND NECK CT PERFUSION BRAIN TECHNIQUE: Multidetector CT imaging of the head and neck was performed using the standard protocol during bolus administration of intravenous contrast. Multiplanar CT image reconstructions and MIPs were obtained to evaluate the vascular anatomy. Carotid stenosis measurements (when applicable) are obtained utilizing NASCET criteria, using the distal internal carotid diameter as the denominator. Multiphase CT imaging of the brain was performed following IV bolus contrast injection. Subsequent parametric perfusion maps were calculated  using RAPID software. CONTRAST:  125mL ISOVUE-370 IOPAMIDOL (ISOVUE-370) INJECTION 76% COMPARISON:  None. FINDINGS: CT HEAD FINDINGS Brain: No acute large territory infarct, intracranial hemorrhage, mass, midline shift, or extra-axial fluid collection is identified. 9 mm hypodensity in the ventral right thalamus has a chronic appearance on coronal and sagittal reformats. Scattered subcortical and periventricular white matter hypodensities are nonspecific but compatible with mild chronic small vessel ischemic disease. The ventricles and sulci are normal. Vascular: Calcified atherosclerosis at the skull base. Skull: No fracture. Nonspecific 2 cm lucency in the right frontal bone. Sinuses/Orbits: Visualized paranasal sinuses and mastoid air cells are clear. Visualized orbits are unremarkable. Other: None. ASPECTS [facility] Stroke Program Early CT Score) Not scored due to symptoms suggesting a posterior fossa infarct. CTA NECK FINDINGS Aortic arch: Standard 3 vessel aortic arch. Moderate aortic atherosclerosis. Widely patent arch vessel origins. Right carotid system: Patent with calcified and soft plaque at the carotid bifurcation resulting in 50% proximal ICA stenosis. Left carotid system: Patent with calcified plaque at the carotid bifurcation resulting in 40% proximal ICA stenosis. Vertebral arteries: Both vertebral arteries are patent with the left being strongly dominant. There is scattered nonstenotic left vertebral artery plaque. The right vertebral artery is diffusely hypoplastic without significant focal stenosis. Skeleton: Lower cervical disc degeneration, severe at C6-7. Other neck: No mass or enlarged lymph nodes. Upper chest: Grossly clear lung apices allowing for motion artifact. Review of the MIP images confirms the above findings CTA HEAD FINDINGS Anterior circulation: The internal carotid arteries are patent from skull base to carotid termini with siphon atherosclerosis bilaterally. There is  mild-to-moderate focal proximal right supraclinoid stenosis (series 13, image 240). MCAs are patent without evidence of proximal branch occlusion or flow limiting proximal stenosis. The ACAs are patent without significant right A1 stenosis. The left A1 segment is diffusely small and irregular with superimposed severe focal narrowing including of its origin. No aneurysm or vascular malformation. Posterior circulation: The intracranial left vertebral artery is patent with mild atherosclerotic irregularity but no stenosis. The intracranial right vertebral artery is diminutive, particularly distal to the PICA origin with full patency to the vertebrobasilar junction difficult to confirm due to its small size resulting in poor visualization of the distal most right V4 segment. Patent PICA origins are identified bilaterally, with the left PICA appearing dominant. The right PICA is small and poorly visualized. The SCAs are small and poorly evaluated, particularly on the left. The basilar artery is patent with mild diffuse irregularity but no stenosis. There is likely a diminutive right posterior communicating artery. The PCAs are patent with branch vessel irregularity but no evidence of significant proximal stenosis. No aneurysm or vascular malformation. Venous sinuses: Patent. Anatomic variants: None. Delayed phase: No  abnormal enhancement. Review of the MIP images confirms the above findings CT Brain Perfusion Findings: CBF (<30%) Volume: 9mL Perfusion (Tmax>6.0s) volume: 60mL, located in the anteromedial left frontal lobe (ACA territory) and right temporoparietal white matter Mismatch Volume: 25mL Infarction Location: None. Preserved cerebral blood flow and blood volume in the above-mentioned areas with delayed perfusion. IMPRESSION: 1. No intracranial hemorrhage or acute infarct evident on CT. 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 2. No emergent large  vessel occlusion. 3. Strongly dominant left vertebral artery without stenosis. 4. Diffusely hypoplastic right vertebral artery, poorly visualized distal to the PICA origin. 5. Bilateral proximal ICA stenoses, 50% on the right and 40% on the left. 6. Severe left A1 stenoses with mildly delayed perfusion in the medial left frontal lobe but no evidence of core infarct. 7. Mild-to-moderate right supraclinoid ICA stenosis. 8.  Aortic Atherosclerosis (ICD10-I70.0). These results were called by telephone at the time of interpretation on 02/06/2018 at 2:55 pm to Dr. Marjean Donna , who verbally acknowledged these results. Electronically Signed   By: Logan Bores M.D.   On: 02/06/2018 15:29   Mr Brain Wo Contrast  Result Date: 02/06/2018 CLINICAL DATA:  Dizziness and double vision. History of prostate cancer and hypertension. EXAM: MRI HEAD WITHOUT CONTRAST TECHNIQUE: Multiplanar, multiecho pulse sequences of the brain and surrounding structures were obtained without intravenous contrast. COMPARISON:  Head CT 02/06/2018 FINDINGS: BRAIN: The midline structures are normal. There is no acute infarct or acute hemorrhage. No mass lesion, hydrocephalus, dural abnormality or extra-axial collection. Early confluent hyperintense T2-weighted signal of the periventricular and deep white matter, most commonly due to chronic ischemic microangiopathy. Old right basal ganglia and right thalamus lacunar infarcts. No age-advanced or lobar predominant atrophy. No chronic microhemorrhage or superficial siderosis. VASCULAR: Major intracranial arterial and venous sinus flow voids are preserved. SKULL AND UPPER CERVICAL SPINE: The visualized skull base, calvarium, upper cervical spine and extracranial soft tissues are normal. SINUSES/ORBITS: No fluid levels or advanced mucosal thickening. No mastoid or middle ear effusion. Normal orbits. IMPRESSION: 1. No acute intracranial abnormality. 2. Chronic ischemic microangiopathy. Electronically  Signed   By: Ulyses Jarred M.D.   On: 02/06/2018 22:18   US Carotid Bilateral (at [facility] And [facility] Only)  Result Date: 02/07/2018 CLINICAL DATA:  Stroke EXAM: BILATERAL CAROTID DUPLEX ULTRASOUND TECHNIQUE: Pearline Cables scale imaging, color Doppler and duplex ultrasound was performed of bilateral carotid and vertebral arteries in the neck. COMPARISON:  CTA 4 19 2019 TECHNIQUE: Quantification of carotid stenosis is based on velocity parameters that correlate the residual internal carotid diameter with NASCET-based stenosis levels, using the diameter of the distal internal carotid lumen as the denominator for stenosis measurement. The following velocity measurements were obtained: PEAK SYSTOLIC/END DIASTOLIC RIGHT ICA:                     143/40cm/sec CCA:                     56/43PI/RJJ SYSTOLIC ICA/CCA RATIO:  1.9 ECA:                     158cm/sec LEFT ICA:                     102/32cm/sec CCA:                     88/41YS/AYT SYSTOLIC ICA/CCA RATIO:  1.1 ECA:  184cm/sec FINDINGS: RIGHT CAROTID ARTERY: Scattered nonocclusive plaque through the common carotid artery. Heavier eccentric somewhat irregular plaque in the carotid bulb resulting in at least mild stenosis. Normal waveforms and color Doppler signal. RIGHT VERTEBRAL ARTERY:  Normal flow direction and waveform. LEFT CAROTID ARTERY: Scattered nonocclusive plaque through the common carotid artery and bulb. Eccentric plaque in the proximal ICA resulting in at least mild stenosis. Normal waveforms and color Doppler signal. A nonspecific cardiac arrhythmia noted. LEFT VERTEBRAL ARTERY: Normal flow direction and waveform. IMPRESSION: 1. Bilateral carotid bifurcation and proximal ICA plaque, resulting in less than 50% diameter stenosis on the left, 50-69% diameter stenosis on the right. 2.  Antegrade bilateral vertebral arterial flow. Electronically Signed   By: Lucrezia Europe M.D.   On: 02/07/2018 09:20   Ct Cerebral Perfusion W Contrast  Result Date:  02/06/2018 CLINICAL DATA:  Diplopia and dizziness. Third nerve palsy. Concern for posterior circulation/brainstem infarct. EXAM: CT ANGIOGRAPHY HEAD AND NECK CT PERFUSION BRAIN TECHNIQUE: Multidetector CT imaging of the head and neck was performed using the standard protocol during bolus administration of intravenous contrast. Multiplanar CT image reconstructions and MIPs were obtained to evaluate the vascular anatomy. Carotid stenosis measurements (when applicable) are obtained utilizing NASCET criteria, using the distal internal carotid diameter as the denominator. Multiphase CT imaging of the brain was performed following IV bolus contrast injection. Subsequent parametric perfusion maps were calculated using RAPID software. CONTRAST:  137mL ISOVUE-370 IOPAMIDOL (ISOVUE-370) INJECTION 76% COMPARISON:  None. FINDINGS: CT HEAD FINDINGS Brain: No acute large territory infarct, intracranial hemorrhage, mass, midline shift, or extra-axial fluid collection is identified. 9 mm hypodensity in the ventral right thalamus has a chronic appearance on coronal and sagittal reformats. Scattered subcortical and periventricular white matter hypodensities are nonspecific but compatible with mild chronic small vessel ischemic disease. The ventricles and sulci are normal. Vascular: Calcified atherosclerosis at the skull base. Skull: No fracture. Nonspecific 2 cm lucency in the right frontal bone. Sinuses/Orbits: Visualized paranasal sinuses and mastoid air cells are clear. Visualized orbits are unremarkable. Other: None. ASPECTS [facility] Stroke Program Early CT Score) Not scored due to symptoms suggesting a posterior fossa infarct. CTA NECK FINDINGS Aortic arch: Standard 3 vessel aortic arch. Moderate aortic atherosclerosis. Widely patent arch vessel origins. Right carotid system: Patent with calcified and soft plaque at the carotid bifurcation resulting in 50% proximal ICA stenosis. Left carotid system: Patent with calcified plaque  at the carotid bifurcation resulting in 40% proximal ICA stenosis. Vertebral arteries: Both vertebral arteries are patent with the left being strongly dominant. There is scattered nonstenotic left vertebral artery plaque. The right vertebral artery is diffusely hypoplastic without significant focal stenosis. Skeleton: Lower cervical disc degeneration, severe at C6-7. Other neck: No mass or enlarged lymph nodes. Upper chest: Grossly clear lung apices allowing for motion artifact. Review of the MIP images confirms the above findings CTA HEAD FINDINGS Anterior circulation: The internal carotid arteries are patent from skull base to carotid termini with siphon atherosclerosis bilaterally. There is mild-to-moderate focal proximal right supraclinoid stenosis (series 13, image 240). MCAs are patent without evidence of proximal branch occlusion or flow limiting proximal stenosis. The ACAs are patent without significant right A1 stenosis. The left A1 segment is diffusely small and irregular with superimposed severe focal narrowing including of its origin. No aneurysm or vascular malformation. Posterior circulation: The intracranial left vertebral artery is patent with mild atherosclerotic irregularity but no stenosis. The intracranial right vertebral artery is diminutive, particularly distal to the PICA origin with full  patency to the vertebrobasilar junction difficult to confirm due to its small size resulting in poor visualization of the distal most right V4 segment. Patent PICA origins are identified bilaterally, with the left PICA appearing dominant. The right PICA is small and poorly visualized. The SCAs are small and poorly evaluated, particularly on the left. The basilar artery is patent with mild diffuse irregularity but no stenosis. There is likely a diminutive right posterior communicating artery. The PCAs are patent with branch vessel irregularity but no evidence of significant proximal stenosis. No aneurysm or  vascular malformation. Venous sinuses: Patent. Anatomic variants: None. Delayed phase: No abnormal enhancement. Review of the MIP images confirms the above findings CT Brain Perfusion Findings: CBF (<30%) Volume: 54mL Perfusion (Tmax>6.0s) volume: 79mL, located in the anteromedial left frontal lobe (ACA territory) and right temporoparietal white matter Mismatch Volume: 23mL Infarction Location: None. Preserved cerebral blood flow and blood volume in the above-mentioned areas with delayed perfusion. IMPRESSION: 1. No intracranial hemorrhage or acute infarct evident on CT. 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 2. No emergent large vessel occlusion. 3. Strongly dominant left vertebral artery without stenosis. 4. Diffusely hypoplastic right vertebral artery, poorly visualized distal to the PICA origin. 5. Bilateral proximal ICA stenoses, 50% on the right and 40% on the left. 6. Severe left A1 stenoses with mildly delayed perfusion in the medial left frontal lobe but no evidence of core infarct. 7. Mild-to-moderate right supraclinoid ICA stenosis. 8.  Aortic Atherosclerosis (ICD10-I70.0). These results were called by telephone at the time of interpretation on 02/06/2018 at 2:55 pm to Dr. Marjean Donna , who verbally acknowledged these results. Electronically Signed   By: Logan Bores M.D.   On: 02/06/2018 15:29   Ct Head Code Stroke Wo Contrast  Result Date: 02/06/2018 CLINICAL DATA:  Diplopia and dizziness. Third nerve palsy. Concern for posterior circulation/brainstem infarct. EXAM: CT ANGIOGRAPHY HEAD AND NECK CT PERFUSION BRAIN TECHNIQUE: Multidetector CT imaging of the head and neck was performed using the standard protocol during bolus administration of intravenous contrast. Multiplanar CT image reconstructions and MIPs were obtained to evaluate the vascular anatomy. Carotid stenosis measurements (when applicable) are obtained utilizing NASCET  criteria, using the distal internal carotid diameter as the denominator. Multiphase CT imaging of the brain was performed following IV bolus contrast injection. Subsequent parametric perfusion maps were calculated using RAPID software. CONTRAST:  162mL ISOVUE-370 IOPAMIDOL (ISOVUE-370) INJECTION 76% COMPARISON:  None. FINDINGS: CT HEAD FINDINGS Brain: No acute large territory infarct, intracranial hemorrhage, mass, midline shift, or extra-axial fluid collection is identified. 9 mm hypodensity in the ventral right thalamus has a chronic appearance on coronal and sagittal reformats. Scattered subcortical and periventricular white matter hypodensities are nonspecific but compatible with mild chronic small vessel ischemic disease. The ventricles and sulci are normal. Vascular: Calcified atherosclerosis at the skull base. Skull: No fracture. Nonspecific 2 cm lucency in the right frontal bone. Sinuses/Orbits: Visualized paranasal sinuses and mastoid air cells are clear. Visualized orbits are unremarkable. Other: None. ASPECTS [facility] Stroke Program Early CT Score) Not scored due to symptoms suggesting a posterior fossa infarct. CTA NECK FINDINGS Aortic arch: Standard 3 vessel aortic arch. Moderate aortic atherosclerosis. Widely patent arch vessel origins. Right carotid system: Patent with calcified and soft plaque at the carotid bifurcation resulting in 50% proximal ICA stenosis. Left carotid system: Patent with calcified plaque at the carotid bifurcation resulting in 40% proximal ICA stenosis. Vertebral arteries: Both vertebral arteries are patent  with the left being strongly dominant. There is scattered nonstenotic left vertebral artery plaque. The right vertebral artery is diffusely hypoplastic without significant focal stenosis. Skeleton: Lower cervical disc degeneration, severe at C6-7. Other neck: No mass or enlarged lymph nodes. Upper chest: Grossly clear lung apices allowing for motion artifact. Review of the MIP  images confirms the above findings CTA HEAD FINDINGS Anterior circulation: The internal carotid arteries are patent from skull base to carotid termini with siphon atherosclerosis bilaterally. There is mild-to-moderate focal proximal right supraclinoid stenosis (series 13, image 240). MCAs are patent without evidence of proximal branch occlusion or flow limiting proximal stenosis. The ACAs are patent without significant right A1 stenosis. The left A1 segment is diffusely small and irregular with superimposed severe focal narrowing including of its origin. No aneurysm or vascular malformation. Posterior circulation: The intracranial left vertebral artery is patent with mild atherosclerotic irregularity but no stenosis. The intracranial right vertebral artery is diminutive, particularly distal to the PICA origin with full patency to the vertebrobasilar junction difficult to confirm due to its small size resulting in poor visualization of the distal most right V4 segment. Patent PICA origins are identified bilaterally, with the left PICA appearing dominant. The right PICA is small and poorly visualized. The SCAs are small and poorly evaluated, particularly on the left. The basilar artery is patent with mild diffuse irregularity but no stenosis. There is likely a diminutive right posterior communicating artery. The PCAs are patent with branch vessel irregularity but no evidence of significant proximal stenosis. No aneurysm or vascular malformation. Venous sinuses: Patent. Anatomic variants: None. Delayed phase: No abnormal enhancement. Review of the MIP images confirms the above findings CT Brain Perfusion Findings: CBF (<30%) Volume: 54mL Perfusion (Tmax>6.0s) volume: 70mL, located in the anteromedial left frontal lobe (ACA territory) and right temporoparietal white matter Mismatch Volume: 44mL Infarction Location: None. Preserved cerebral blood flow and blood volume in the above-mentioned areas with delayed perfusion.  IMPRESSION: 1. No intracranial hemorrhage or acute infarct evident on CT. 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 2. No emergent large vessel occlusion. 3. Strongly dominant left vertebral artery without stenosis. 4. Diffusely hypoplastic right vertebral artery, poorly visualized distal to the PICA origin. 5. Bilateral proximal ICA stenoses, 50% on the right and 40% on the left. 6. Severe left A1 stenoses with mildly delayed perfusion in the medial left frontal lobe but no evidence of core infarct. 7. Mild-to-moderate right supraclinoid ICA stenosis. 8.  Aortic Atherosclerosis (ICD10-I70.0). These results were called by telephone at the time of interpretation on 02/06/2018 at 2:55 pm to Dr. Marjean Donna , who verbally acknowledged these results. Electronically Signed   By: Logan Bores M.D.   On: 02/06/2018 15:29    EKG:   Orders placed or performed during the hospital encounter of 02/06/18  . ED EKG  . ED EKG  . ED EKG  . ED EKG  . EKG 12-Lead  . EKG 12-Lead  . EKG 12-Lead  . EKG 12-Lead      Management plans discussed with the patient, family and they are in agreement.  CODE STATUS:     Code Status Orders  (From admission, onward)        Start     Ordered   02/06/18 1642  Full code  Continuous     02/06/18 1641    Code Status History    This patient has a current code status but no historical  code status.      TOTAL TIME TAKING CARE OF THIS PATIENT: 43  minutes.   Note: This dictation was prepared with Dragon dictation along with smaller phrase technology. Any transcriptional errors that result from this process are unintentional.   @M[facility] @  on 02/08/2018 at 2:28 PM  Between 7am to 6pm - Pager - [phone]  After 6pm go to [email or web address] - password [email or web address]  Office  [phone]  CC: Primary care physician; Wenda Low, MD

## 2018-02-09 ENCOUNTER — Telehealth: Payer: Self-pay | Admitting: *Deleted

## 2018-02-09 LAB — HIV ANTIBODY (ROUTINE TESTING W REFLEX): HIV Screen 4th Generation wRfx: NONREACTIVE

## 2018-02-09 NOTE — Telephone Encounter (Signed)
RETURNED PATIENT'S PHONE CALL, SPOKE WITH MR. Alex Banks

## 2018-02-11 ENCOUNTER — Telehealth: Payer: Self-pay | Admitting: Medical Oncology

## 2018-02-11 NOTE — Telephone Encounter (Signed)
"   Can Dr Tammi Klippel see records from [place]?"  I  told wife that Dr Tammi Klippel has access to  have records from [facility]. Pt does not know why he is seeing Dr Tammi Klippel. I transferred his call to radiation.

## 2018-02-12 ENCOUNTER — Telehealth: Payer: Self-pay | Admitting: *Deleted

## 2018-02-12 NOTE — Telephone Encounter (Signed)
Called patient to remind of pre-seed planning CT for 02-13-18, spoke with wife and she is aware of this appt.

## 2018-02-13 ENCOUNTER — Ambulatory Visit
Admission: RE | Admit: 2018-02-13 | Discharge: 2018-02-13 | Disposition: A | Discharge status: Home / Self Care | Payer: BLUE CROSS/BLUE SHIELD | Source: Ambulatory Visit | Attending: Radiation Oncology | Admitting: Radiation Oncology

## 2018-02-13 ENCOUNTER — Encounter: Payer: Self-pay | Admitting: Medical Oncology

## 2018-02-13 DIAGNOSIS — Z510 Encounter for antineoplastic radiation therapy: Secondary | ICD-10-CM | POA: Insufficient documentation

## 2018-02-13 DIAGNOSIS — C61 Malignant neoplasm of prostate: Secondary | ICD-10-CM

## 2018-02-13 NOTE — Progress Notes (Signed)
  Radiation Oncology         [phone] ________________________________  Name: Alex Banks MRN: [id number]  Date: 02/13/2018  DOB: Mar 08, 1956  SIMULATION AND TREATMENT PLANNING NOTE PUBIC ARCH STUDY  DX:AJOINO, Denton Ar, MD  Franchot Gallo, MD  DIAGNOSIS: 62 y.o. 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 Malignant neoplasm of prostate ([place]) C61     COMPLEX SIMULATION:  The patient presented today for evaluation for possible prostate seed implant. He was brought to the radiation planning suite and placed supine on the CT couch. A 3-dimensional image study set was obtained in upload to the planning computer. There, on each axial slice, I contoured the prostate gland. Then, using three-dimensional radiation planning tools I reconstructed the prostate in view of the structures from the transperineal needle pathway to assess for possible pubic arch interference. In doing so, I did not appreciate any pubic arch interference. Also, the patient's prostate volume was estimated based on the drawn structure. The volume was 32.4 cc.  Given the pubic arch appearance and prostate volume, patient remains a good candidate to proceed with prostate seed implant. Today, he freely provided informed written consent to proceed.    PLAN: The patient will undergo definitive prostate seed implant to 145 Gy.   ________________________________  Sheral Apley. Tammi Klippel, M.D.  This document serves as a record of services personally performed by Tyler Pita, MD. It was created on his behalf by Arlyce Harman, a trained medical scribe. The creation of this record is based on the scribe's personal observations and the provider's statements to them. This document has been checked and approved by the attending provider.

## 2018-02-23 ENCOUNTER — Other Ambulatory Visit: Payer: Self-pay | Admitting: Urology

## 2018-02-24 ENCOUNTER — Telehealth: Payer: Self-pay | Admitting: *Deleted

## 2018-02-24 NOTE — Telephone Encounter (Signed)
Called patient to inform of implant date, lvm for a return call 

## 2018-03-09 ENCOUNTER — Encounter (HOSPITAL_COMMUNITY)
Admission: RE | Admit: 2018-03-09 | Discharge: 2018-03-09 | Disposition: A | Discharge status: Home / Self Care | Payer: BLUE CROSS/BLUE SHIELD | Source: Ambulatory Visit | Attending: Urology | Admitting: Urology

## 2018-03-09 ENCOUNTER — Ambulatory Visit (HOSPITAL_COMMUNITY)
Admission: RE | Admit: 2018-03-09 | Discharge: 2018-03-09 | Disposition: A | Discharge status: Home / Self Care | Payer: BLUE CROSS/BLUE SHIELD | Source: Ambulatory Visit | Attending: Urology | Admitting: Urology

## 2018-03-09 DIAGNOSIS — Z01818 Encounter for other preprocedural examination: Secondary | ICD-10-CM

## 2018-03-09 DIAGNOSIS — C61 Malignant neoplasm of prostate: Secondary | ICD-10-CM | POA: Diagnosis not present

## 2018-03-30 DIAGNOSIS — G459 Transient cerebral ischemic attack, unspecified: Secondary | ICD-10-CM | POA: Diagnosis not present

## 2018-03-30 DIAGNOSIS — I10 Essential (primary) hypertension: Secondary | ICD-10-CM | POA: Diagnosis not present

## 2018-03-30 DIAGNOSIS — E7800 Pure hypercholesterolemia, unspecified: Secondary | ICD-10-CM | POA: Diagnosis not present

## 2018-03-30 DIAGNOSIS — F17210 Nicotine dependence, cigarettes, uncomplicated: Secondary | ICD-10-CM | POA: Diagnosis not present

## 2018-04-09 ENCOUNTER — Other Ambulatory Visit: Payer: Self-pay | Admitting: Urology

## 2018-04-09 DIAGNOSIS — C61 Malignant neoplasm of prostate: Secondary | ICD-10-CM

## 2018-04-16 ENCOUNTER — Telehealth: Payer: Self-pay | Admitting: *Deleted

## 2018-04-16 NOTE — Telephone Encounter (Signed)
CALLED PATIENT TO INFORM OF NEW IMPLANT DATE, DUE TO THE DOCTOR TAKING VACATION, LVM FOR A RETURN CALL

## 2018-05-04 ENCOUNTER — Other Ambulatory Visit: Payer: Self-pay | Admitting: Urology

## 2018-05-21 ENCOUNTER — Encounter (HOSPITAL_BASED_OUTPATIENT_CLINIC_OR_DEPARTMENT_OTHER): Payer: Self-pay | Admitting: *Deleted

## 2018-05-21 ENCOUNTER — Other Ambulatory Visit: Payer: Self-pay

## 2018-05-21 NOTE — Progress Notes (Signed)
Spoke with patient for pre op interview via telephone. Patient has pre op lab appt 05/27/2018 @ 1pm. Patient to arrive at 0630 for surgery. To take metoprolol and lipitor in AM with sips of water. Fleets enema AM of surgery. NPO after MN. Patient to stop plavix and ASA one week prior to surgery on 05/27/2018.

## 2018-05-26 ENCOUNTER — Telehealth: Payer: Self-pay | Admitting: *Deleted

## 2018-05-26 NOTE — Telephone Encounter (Signed)
CALLED PATIENT TO REMIND OF LAB APPT. FOR 05-27-18 @ 1 PM, LVM FOR A RETURN CALL

## 2018-05-27 ENCOUNTER — Encounter (HOSPITAL_COMMUNITY)
Admission: RE | Admit: 2018-05-27 | Discharge: 2018-05-27 | Disposition: A | Discharge status: Home / Self Care | Payer: BLUE CROSS/BLUE SHIELD | Source: Ambulatory Visit | Attending: Urology | Admitting: Urology

## 2018-05-27 DIAGNOSIS — Z01812 Encounter for preprocedural laboratory examination: Secondary | ICD-10-CM | POA: Diagnosis not present

## 2018-05-27 LAB — PROTIME-INR
INR: 0.95
PROTHROMBIN TIME: 12.6 s (ref 11.4–15.2)

## 2018-05-27 LAB — COMPREHENSIVE METABOLIC PANEL
ALT: 25 U/L (ref 0–44)
AST: 20 U/L (ref 15–41)
Albumin: 4 g/dL (ref 3.5–5.0)
Alkaline Phosphatase: 61 U/L (ref 38–126)
Anion gap: 9 (ref 5–15)
BILIRUBIN TOTAL: 0.7 mg/dL (ref 0.3–1.2)
BUN: 14 mg/dL (ref 8–23)
CHLORIDE: 103 mmol/L (ref 98–111)
CO2: 28 mmol/L (ref 22–32)
CREATININE: 0.95 mg/dL (ref 0.61–1.24)
Calcium: 9.1 mg/dL (ref 8.9–10.3)
Glucose, Bld: 92 mg/dL (ref 70–99)
POTASSIUM: 3.9 mmol/L (ref 3.5–5.1)
Sodium: 140 mmol/L (ref 135–145)
TOTAL PROTEIN: 7.1 g/dL (ref 6.5–8.1)

## 2018-05-27 LAB — APTT: aPTT: 31 seconds (ref 24–36)

## 2018-05-27 LAB — CBC
HCT: 46.1 % (ref 39.0–52.0)
Hemoglobin: 16 g/dL (ref 13.0–17.0)
MCH: 31 pg (ref 26.0–34.0)
MCHC: 34.7 g/dL (ref 30.0–36.0)
MCV: 89.3 fL (ref 78.0–100.0)
PLATELETS: 183 10*3/uL (ref 150–400)
RBC: 5.16 MIL/uL (ref 4.22–5.81)
RDW: 14.8 % (ref 11.5–15.5)
WBC: 7.6 10*3/uL (ref 4.0–10.5)

## 2018-05-29 DIAGNOSIS — R8271 Bacteriuria: Secondary | ICD-10-CM | POA: Diagnosis not present

## 2018-05-29 DIAGNOSIS — C61 Malignant neoplasm of prostate: Secondary | ICD-10-CM | POA: Diagnosis not present

## 2018-06-02 ENCOUNTER — Encounter (HOSPITAL_BASED_OUTPATIENT_CLINIC_OR_DEPARTMENT_OTHER): Payer: Self-pay | Admitting: Anesthesiology

## 2018-06-02 ENCOUNTER — Telehealth: Payer: Self-pay | Admitting: *Deleted

## 2018-06-02 NOTE — Telephone Encounter (Signed)
Called patient to remind of implant for 06-03-18, lvm for a return call

## 2018-06-02 NOTE — Anesthesia Preprocedure Evaluation (Addendum)
Anesthesia Evaluation  Patient identified by MRN, date of birth, ID band Patient awake    Reviewed: Allergy & Precautions, NPO status , Patient's Chart, lab work & pertinent test results, reviewed documented beta blocker date and time   Airway Mallampati: II  TM Distance: >3 FB Neck ROM: Full    Dental  (+) Missing, Caps, Poor Dentition   Pulmonary Current Smoker,  Snores - ? OSA undiagnosed   Pulmonary exam normal breath sounds clear to auscultation       Cardiovascular hypertension, Pt. on medications and Pt. on home beta blockers + Past MI and + Peripheral Vascular Disease  Normal cardiovascular exam Rhythm:Regular Rate:Normal  EKG-02/07/2018 NSR w/ occ PVC, Anteroseptal MI  Echo 4/202019 Left ventricle: The cavity size was normal. Systolic function wall moderately reduced. The estimated ejection fraction was in the range of 35% to 40%. Hypokinesis of the apical myocardium. Hypokinesis of the anteroseptal myocardium. - Aortic valve: There was trivial regurgitation. - Mitral valve: There was mild regurgitation   Neuro/Psych TIACVA, No Residual Symptoms negative psych ROS   GI/Hepatic negative GI ROS, Neg liver ROS,   Endo/Other  Hyperlipidemia  Renal/GU negative Renal ROS   Prostate Ca    Musculoskeletal negative musculoskeletal ROS (+)   Abdominal   Peds  Hematology Plavix-last dose   Anesthesia Other Findings   Reproductive/Obstetrics                            Anesthesia Physical Anesthesia Plan  ASA: III  Anesthesia Plan: General   Post-op Pain Management:    Induction: Intravenous  PONV Risk Score and Plan: 3 and Midazolam, Dexamethasone, Ondansetron and Treatment may vary due to age or medical condition  Airway Management Planned: LMA  Additional Equipment:   Intra-op Plan:   Post-operative Plan: Extubation in OR  Informed Consent: I have reviewed the patients  History and Physical, chart, labs and discussed the procedure including the risks, benefits and alternatives for the proposed anesthesia with the patient or authorized representative who has indicated his/her understanding and acceptance.   Dental advisory given  Plan Discussed with: CRNA and Surgeon  Anesthesia Plan Comments:        Anesthesia Quick Evaluation

## 2018-06-03 ENCOUNTER — Other Ambulatory Visit: Payer: Self-pay

## 2018-06-03 ENCOUNTER — Ambulatory Visit (HOSPITAL_BASED_OUTPATIENT_CLINIC_OR_DEPARTMENT_OTHER): Payer: BLUE CROSS/BLUE SHIELD | Admitting: Anesthesiology

## 2018-06-03 ENCOUNTER — Encounter (HOSPITAL_BASED_OUTPATIENT_CLINIC_OR_DEPARTMENT_OTHER): Payer: Self-pay | Admitting: *Deleted

## 2018-06-03 ENCOUNTER — Ambulatory Visit (HOSPITAL_COMMUNITY): Payer: BLUE CROSS/BLUE SHIELD

## 2018-06-03 ENCOUNTER — Ambulatory Visit (HOSPITAL_BASED_OUTPATIENT_CLINIC_OR_DEPARTMENT_OTHER)
Admission: RE | Admit: 2018-06-03 | Discharge: 2018-06-03 | Disposition: A | Discharge status: Home / Self Care | Payer: BLUE CROSS/BLUE SHIELD | Source: Ambulatory Visit | Attending: Urology | Admitting: Urology

## 2018-06-03 ENCOUNTER — Encounter (HOSPITAL_BASED_OUTPATIENT_CLINIC_OR_DEPARTMENT_OTHER)
Admission: RE | Disposition: A | Discharge status: Home / Self Care | Payer: Self-pay | Source: Ambulatory Visit | Attending: Urology

## 2018-06-03 DIAGNOSIS — I10 Essential (primary) hypertension: Secondary | ICD-10-CM | POA: Diagnosis not present

## 2018-06-03 DIAGNOSIS — I252 Old myocardial infarction: Secondary | ICD-10-CM | POA: Diagnosis not present

## 2018-06-03 DIAGNOSIS — Z7902 Long term (current) use of antithrombotics/antiplatelets: Secondary | ICD-10-CM | POA: Insufficient documentation

## 2018-06-03 DIAGNOSIS — Z8673 Personal history of transient ischemic attack (TIA), and cerebral infarction without residual deficits: Secondary | ICD-10-CM | POA: Diagnosis not present

## 2018-06-03 DIAGNOSIS — F17210 Nicotine dependence, cigarettes, uncomplicated: Secondary | ICD-10-CM | POA: Insufficient documentation

## 2018-06-03 DIAGNOSIS — E785 Hyperlipidemia, unspecified: Secondary | ICD-10-CM | POA: Diagnosis not present

## 2018-06-03 DIAGNOSIS — Z7982 Long term (current) use of aspirin: Secondary | ICD-10-CM | POA: Diagnosis not present

## 2018-06-03 DIAGNOSIS — I739 Peripheral vascular disease, unspecified: Secondary | ICD-10-CM | POA: Insufficient documentation

## 2018-06-03 DIAGNOSIS — C61 Malignant neoplasm of prostate: Secondary | ICD-10-CM | POA: Insufficient documentation

## 2018-06-03 DIAGNOSIS — Z79899 Other long term (current) drug therapy: Secondary | ICD-10-CM | POA: Diagnosis not present

## 2018-06-03 HISTORY — PX: CYSTOSCOPY: SHX5120

## 2018-06-03 HISTORY — PX: RADIOACTIVE SEED IMPLANT: SHX5150

## 2018-06-03 HISTORY — DX: Presence of spectacles and contact lenses: Z97.3

## 2018-06-03 HISTORY — DX: Transient cerebral ischemic attack, unspecified: G45.9

## 2018-06-03 SURGERY — INSERTION, RADIATION SOURCE, PROSTATE
Anesthesia: General | Site: Prostate

## 2018-06-03 MED ORDER — PROPOFOL 10 MG/ML IV BOLUS
INTRAVENOUS | Status: DC | PRN
  Administered 2018-06-03: 200 mg via INTRAVENOUS

## 2018-06-03 MED ORDER — FENTANYL CITRATE (PF) 100 MCG/2ML IJ SOLN
INTRAMUSCULAR | Status: DC | PRN
  Administered 2018-06-03: 50 ug via INTRAVENOUS
  Administered 2018-06-03: 100 ug via INTRAVENOUS
  Administered 2018-06-03: 50 ug via INTRAVENOUS

## 2018-06-03 MED ORDER — DEXAMETHASONE SODIUM PHOSPHATE 4 MG/ML IJ SOLN
INTRAMUSCULAR | Status: DC | PRN
  Administered 2018-06-03: 10 mg via INTRAVENOUS

## 2018-06-03 MED ORDER — MEPERIDINE HCL 25 MG/ML IJ SOLN
6.2500 mg | INTRAMUSCULAR | Status: DC | PRN
  Filled 2018-06-03: qty 1

## 2018-06-03 MED ORDER — ONDANSETRON HCL 4 MG/2ML IJ SOLN
INTRAMUSCULAR | Status: DC | PRN
  Administered 2018-06-03: 4 mg via INTRAVENOUS

## 2018-06-03 MED ORDER — MIDAZOLAM HCL 2 MG/2ML IJ SOLN
INTRAMUSCULAR | Status: AC
  Filled 2018-06-03: qty 2

## 2018-06-03 MED ORDER — MIDAZOLAM HCL 5 MG/5ML IJ SOLN
INTRAMUSCULAR | Status: DC | PRN
  Administered 2018-06-03: 2 mg via INTRAVENOUS

## 2018-06-03 MED ORDER — HYDROMORPHONE HCL 1 MG/ML IJ SOLN
0.2500 mg | INTRAMUSCULAR | Status: DC | PRN
Start: 2018-06-03 — End: 2018-06-03
  Filled 2018-06-03: qty 0.5

## 2018-06-03 MED ORDER — FENTANYL CITRATE (PF) 100 MCG/2ML IJ SOLN
INTRAMUSCULAR | Status: AC
  Filled 2018-06-03: qty 2

## 2018-06-03 MED ORDER — ONDANSETRON HCL 4 MG/2ML IJ SOLN
4.0000 mg | Freq: Once | INTRAMUSCULAR | Status: DC | PRN
  Filled 2018-06-03: qty 2

## 2018-06-03 MED ORDER — TRAMADOL HCL 50 MG PO TABS: 50.0000 mg | ORAL_TABLET | Freq: Four times a day (QID) | ORAL | 0 refills | Status: AC | PRN

## 2018-06-03 MED ORDER — ONDANSETRON HCL 4 MG/2ML IJ SOLN
INTRAMUSCULAR | Status: AC
  Filled 2018-06-03: qty 2

## 2018-06-03 MED ORDER — LACTATED RINGERS IV SOLN
INTRAVENOUS | Status: DC
  Administered 2018-06-03 (×2): via INTRAVENOUS
  Filled 2018-06-03: qty 1000

## 2018-06-03 MED ORDER — CEFAZOLIN SODIUM-DEXTROSE 2-4 GM/100ML-% IV SOLN
INTRAVENOUS | Status: AC
  Filled 2018-06-03: qty 100

## 2018-06-03 MED ORDER — LIDOCAINE HCL (CARDIAC) PF 100 MG/5ML IV SOSY
PREFILLED_SYRINGE | INTRAVENOUS | Status: DC | PRN
  Administered 2018-06-03: 100 mg via INTRAVENOUS

## 2018-06-03 MED ORDER — HYDROCODONE-ACETAMINOPHEN 7.5-325 MG PO TABS
1.0000 | ORAL_TABLET | Freq: Once | ORAL | Status: DC | PRN
  Filled 2018-06-03: qty 1

## 2018-06-03 MED ORDER — DEXAMETHASONE SODIUM PHOSPHATE 10 MG/ML IJ SOLN
INTRAMUSCULAR | Status: AC
  Filled 2018-06-03: qty 1

## 2018-06-03 MED ORDER — EPHEDRINE SULFATE 50 MG/ML IJ SOLN
INTRAMUSCULAR | Status: DC | PRN
  Administered 2018-06-03 (×5): 10 mg via INTRAVENOUS

## 2018-06-03 MED ORDER — IOHEXOL 300 MG/ML  SOLN
INTRAMUSCULAR | Status: DC | PRN
  Administered 2018-06-03: 7 mL

## 2018-06-03 MED ORDER — PROPOFOL 10 MG/ML IV BOLUS
INTRAVENOUS | Status: AC
  Filled 2018-06-03: qty 40

## 2018-06-03 MED ORDER — EPHEDRINE 5 MG/ML INJ
INTRAVENOUS | Status: AC
  Filled 2018-06-03: qty 10

## 2018-06-03 MED ORDER — SODIUM CHLORIDE 0.9 % IR SOLN
Status: DC | PRN
  Administered 2018-06-03: 1000 mL

## 2018-06-03 MED ORDER — KETOROLAC TROMETHAMINE 30 MG/ML IJ SOLN
INTRAMUSCULAR | Status: DC | PRN
  Administered 2018-06-03: 30 mg via INTRAVENOUS

## 2018-06-03 MED ORDER — FLEET ENEMA 7-19 GM/118ML RE ENEM
1.0000 | ENEMA | Freq: Once | RECTAL | Status: AC
  Administered 2018-06-03: 1 via RECTAL
  Filled 2018-06-03: qty 1

## 2018-06-03 MED ORDER — KETOROLAC TROMETHAMINE 30 MG/ML IJ SOLN
INTRAMUSCULAR | Status: AC
  Filled 2018-06-03: qty 1

## 2018-06-03 MED ORDER — LIDOCAINE 2% (20 MG/ML) 5 ML SYRINGE
INTRAMUSCULAR | Status: AC
  Filled 2018-06-03: qty 5

## 2018-06-03 MED ORDER — CEFAZOLIN SODIUM-DEXTROSE 2-4 GM/100ML-% IV SOLN
2.0000 g | Freq: Once | INTRAVENOUS | Status: AC
  Administered 2018-06-03: 2 g via INTRAVENOUS
  Filled 2018-06-03: qty 100

## 2018-06-03 SURGICAL SUPPLY — 41 items
BAG URINE DRAINAGE (UROLOGICAL SUPPLIES) ×3 IMPLANT
BLADE CLIPPER SURG (BLADE) ×3 IMPLANT
CATH FOLEY 2WAY SLVR  5CC 16FR (CATHETERS) ×1
CATH FOLEY 2WAY SLVR 5CC 16FR (CATHETERS) ×2 IMPLANT
CATH ROBINSON RED A/P 16FR (CATHETERS) IMPLANT
CATH ROBINSON RED A/P 20FR (CATHETERS) ×3 IMPLANT
CLOTH BEACON ORANGE TIMEOUT ST (SAFETY) ×3 IMPLANT
CONT SPECI 4OZ STER CLIK (MISCELLANEOUS) IMPLANT
COVER BACK TABLE 60X90IN (DRAPES) ×3 IMPLANT
COVER MAYO STAND STRL (DRAPES) ×3 IMPLANT
DRSG TEGADERM 4X4.75 (GAUZE/BANDAGES/DRESSINGS) ×3 IMPLANT
DRSG TEGADERM 8X12 (GAUZE/BANDAGES/DRESSINGS) ×3 IMPLANT
GAUZE SPONGE 4X4 12PLY STRL (GAUZE/BANDAGES/DRESSINGS) ×3 IMPLANT
GLOVE BIO SURGEON STRL SZ 6 (GLOVE) ×3 IMPLANT
GLOVE BIO SURGEON STRL SZ7 (GLOVE) ×3 IMPLANT
GLOVE BIO SURGEON STRL SZ8 (GLOVE) ×6 IMPLANT
GLOVE BIOGEL PI IND STRL 6 (GLOVE) IMPLANT
GLOVE BIOGEL PI IND STRL 7.5 (GLOVE) ×4 IMPLANT
GLOVE BIOGEL PI IND STRL 8 (GLOVE) IMPLANT
GLOVE BIOGEL PI IND STRL 8.5 (GLOVE) ×4 IMPLANT
GLOVE BIOGEL PI INDICATOR 6 (GLOVE)
GLOVE BIOGEL PI INDICATOR 7.5 (GLOVE) ×2
GLOVE BIOGEL PI INDICATOR 8 (GLOVE)
GLOVE BIOGEL PI INDICATOR 8.5 (GLOVE) ×2
GLOVE ECLIPSE 8.0 STRL XLNG CF (GLOVE) ×12 IMPLANT
GLOVE INDICATOR 7.0 STRL GRN (GLOVE) IMPLANT
GLOVE INDICATOR 8.5 STRL (GLOVE) ×3 IMPLANT
GOWN STRL REUS W/TWL LRG LVL3 (GOWN DISPOSABLE) ×6 IMPLANT
GOWN STRL REUS W/TWL XL LVL3 (GOWN DISPOSABLE) ×9 IMPLANT
HOLDER FOLEY CATH W/STRAP (MISCELLANEOUS) ×3 IMPLANT
I-SEEDAGX100 ×255 IMPLANT
IMPL SPACEOAR SYSTEM 10ML (MISCELLANEOUS) IMPLANT
IMPLANT SPACEOAR SYSTEM 10ML (MISCELLANEOUS)
IV NS 1000ML (IV SOLUTION) ×1
IV NS 1000ML BAXH (IV SOLUTION) ×2 IMPLANT
KIT TURNOVER CYSTO (KITS) ×3 IMPLANT
MARKER SKIN DUAL TIP RULER LAB (MISCELLANEOUS) IMPLANT
PACK CYSTO (CUSTOM PROCEDURE TRAY) ×3 IMPLANT
SYR 10ML LL (SYRINGE) ×3 IMPLANT
UNDERPAD 30X30 (UNDERPADS AND DIAPERS) ×6 IMPLANT
WATER STERILE IRR 500ML POUR (IV SOLUTION) ×3 IMPLANT

## 2018-06-03 NOTE — Anesthesia Postprocedure Evaluation (Signed)
Anesthesia Post Note  Patient: Alex Banks  Procedure(s) Performed: RADIOACTIVE SEED IMPLANT/BRACHYTHERAPY IMPLANT (N/A Prostate) CYSTOSCOPY FLEXIBLE (N/A Bladder)     Patient location during evaluation: PACU Anesthesia Type: General Level of consciousness: awake and alert and oriented Pain management: pain level controlled Vital Signs Assessment: post-procedure vital signs reviewed and stable Respiratory status: spontaneous breathing, nonlabored ventilation and respiratory function stable Cardiovascular status: blood pressure returned to baseline and stable Postop Assessment: no apparent nausea or vomiting Anesthetic complications: no    Last Vitals:  Vitals:   06/03/18 1030 06/03/18 1045  BP: (!) 142/63 132/74  Pulse: 82 80  Resp: 17 20  Temp:    SpO2: 96% 95%    Last Pain:  Vitals:   06/03/18 1045  TempSrc:   PainSc: 0-No pain                 Valor [place] A.

## 2018-06-03 NOTE — Discharge Instructions (Signed)
Radioactive Seed Implant Home Care Instructions   Activity:    Rest for the remainder of the day.  Do not drive or operate equipment today.  You may resume normal  activities in a few days as instructed by your physician, without risk of harmful radiation exposure to those around you, provided you follow the time and distance precautions on the Radiation Oncology Instruction Sheet.   Meals: Drink plenty of lipuids and eat light foods, such as gelatin or soup this evening .  You may return to normal meal plan tomorrow.  Return To Work: You may return to work as instructed by [employer].  Special Instruction:   If any seeds are found, use tweezers to pick up seeds and place in a glass container of any kind and bring to your physician's office.  Call your physician if any of these symptoms occur:   Persistent or heavy bleeding  Urine stream diminishes or stops completely after catheter is removed  Fever equal to or greater than 101 degrees F  Cloudy urine with a strong foul odor  Severe pain  You may feel some burning pain and/or hesitancy when you urinate after the catheter is removed.  These symptoms may increase over the next few weeks, but should diminish within forur to six weeks.  Applying moist heat to the lower abdomen or a hot tub bath may help relieve the pain.  If the discomfort becomes severe, please call your physician for additional medications.   Post Anesthesia Home Care Instructions  Activity: Get plenty of rest for the remainder of the day. A responsible individual must stay with you for 24 hours following the procedure.  For the next 24 hours, DO NOT: -Drive a car -Paediatric nurse -Drink alcoholic beverages -Take any medication unless instructed by your physician -Make any legal decisions or sign important papers.  Meals: Start with liquid foods such as gelatin or soup. Progress to regular foods as tolerated. Avoid greasy, spicy, heavy foods. If nausea  and/or vomiting occur, drink only clear liquids until the nausea and/or vomiting subsides. Call your physician if vomiting continues.  Special Instructions/Symptoms: Your throat may feel dry or sore from the anesthesia or the breathing tube placed in your throat during surgery. If this causes discomfort, gargle with warm salt water. The discomfort should disappear within 24 hours.  If you had a scopolamine patch placed behind your ear for the management of post- operative nausea and/or vomiting:  1. The medication in the patch is effective for 72 hours, after which it should be removed.  Wrap patch in a tissue and discard in the trash. Wash hands thoroughly with soap and water. 2. You may remove the patch earlier than 72 hours if you experience unpleasant side effects which may include dry mouth, dizziness or visual disturbances. 3. Avoid touching the patch. Wash your hands with soap and water after contact with the patch.    Do not take ibuprofen until after 4 pm today if needed

## 2018-06-03 NOTE — Transfer of Care (Signed)
Immediate Anesthesia Transfer of Care Note  Patient: Alex Banks  Procedure(s) Performed: Procedure(s) (LRB): RADIOACTIVE SEED IMPLANT/BRACHYTHERAPY IMPLANT (N/A) CYSTOSCOPY FLEXIBLE (N/A)  Patient Location: PACU  Anesthesia Type: General  Level of Consciousness: awake, sedated, patient cooperative and responds to stimulation  Airway & Oxygen Therapy: Patient Spontanous Breathing and Patient connected to [place] O2  Post-op Assessment: Report given to PACU RN, Post -op Vital signs reviewed and stable and Patient moving all extremities  Post vital signs: Reviewed and stable  Complications: No apparent anesthesia complications

## 2018-06-03 NOTE — Progress Notes (Signed)
  Radiation Oncology         [phone] ________________________________  Name: Alex Banks MRN: [id number]  Date: 06/03/2018  DOB: July 08, 1956       Prostate Seed Implant  HO:[id number], Denton Ar, MD  No ref. provider found  DIAGNOSIS: 63 y.o. 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  RADIATION DOSE: 145 Gy, definitive therapy.  TECHNIQUE: Alex Banks was brought to the operating room with the urologist. He was placed in the dorsolithotomy position. He was catheterized and a rectal tube was inserted. The perineum was shaved, prepped and draped. The ultrasound probe was then introduced into the rectum to see the prostate gland.  TREATMENT DEVICE: A needle grid was attached to the ultrasound probe stand and anchor needles were placed.  3D PLANNING: The prostate was imaged in 3D using a sagittal sweep of the prostate probe. These images were transferred to the planning computer. There, the prostate, urethra and rectum were defined on each axial reconstructed image. Then, the software created an optimized 3D plan and a few seed positions were adjusted. The quality of the plan was reviewed using [facility] information for the target and the following two organs at risk:  Urethra and Rectum.  Then the accepted plan was printed and handed off to the radiation therapist.  Under my supervision, the custom loading of the seeds and spacers was carried out and loaded into sealed vicryl sleeves.  These pre-loaded needles were then placed into the needle holder.Marland Kitchen  PROSTATE VOLUME STUDY:  Using transrectal ultrasound the volume of the prostate was verified to be 40.8 cc.  SPECIAL TREATMENT PROCEDURE/SUPERVISION AND HANDLING: The pre-loaded needles were then delivered under sagittal guidance. A total of 30 needles were used to deposit 85 seeds in the  prostate gland. The individual seed activity was 0.357 mCi.  SpaceOAR:  No  COMPLEX SIMULATION: At the end of the procedure, an anterior radiograph of the pelvis was obtained to document seed positioning and count. Cystoscopy was performed to check the urethra and bladder.  MICRODOSIMETRY: At the end of the procedure, the patient was emitting 0.178 mR/hr at 1 meter. Accordingly, he was considered safe for hospital discharge.  PLAN: The patient will return to the radiation oncology clinic for post implant CT dosimetry in three weeks.   ________________________________  Sheral Apley Tammi Klippel, M.D.

## 2018-06-03 NOTE — Interval H&P Note (Signed)
History and Physical Interval Note:  06/03/2018 8:23 AM  Alex Banks  has presented today for surgery, with the diagnosis of PROSTATE CANCER  The various methods of treatment have been discussed with the patient and family. After consideration of risks, benefits and other options for treatment, the patient has consented to  Procedure(s) with comments: RADIOACTIVE SEED IMPLANT/BRACHYTHERAPY IMPLANT (N/A) -        SEEDS IMPLANTED CYSTOSCOPY FLEXIBLE (N/A) - NO SEEDS FOUND IN BLADDER as a surgical intervention .  The patient's history has been reviewed, patient examined, no change in status, stable for surgery.  I have reviewed the patient's chart and labs.  Questions were answered to the patient's satisfaction.     Lillette Boxer Khoi Hamberger

## 2018-06-03 NOTE — Op Note (Signed)
Preoperative diagnosis: Clinical stage TI C adenocarcinoma the prostate   Postoperative diagnosis: Same   Procedure: I-125 prostate seed implantation, flexible cystoscopy  Surgeon: Lillette Boxer. Fahd Galea M.D.  Radiation Oncologist: Tyler Pita, M.D.  Anesthesia: Gen.   Indications: Patient  was diagnosed with clinical stage TI C prostate cancer. We had extensive discussion with him about treatment options versus. He elected to proceed with seed implantation. He underwent consultation my office as well as with Dr. Tammi Klippel. He appeared to understand the advantages disadvantages potential risks of this treatment option. Full informed consent has been obtained.   Technique and findings: Patient was brought the operating room where he had successful induction of general anesthesia. He was placed in dorso-lithotomy position and prepped and draped in usual manner. Appropriate surgical timeout was performed. Radiation oncology department placed a transrectal ultrasound probe anchoring stand. Foley catheter with contrast in the balloon was inserted without difficulty. Anchoring needles were placed within the prostate. Rectal tube was placed. Real-time contouring of the urethra prostate and rectum were performed and the dosing parameters were established. Targeted dose was 145 gray.  I was then called  to the operating suite suite for placement of the needles. A second timeout was performed. All needle passage was done with real-time transrectal ultrasound guidance with the sagittal plane. A total of 35 needles were placed. The implantation itself was done with the robotic implanter. 80 active seeds were implanted.  IThe Foley catheter was removed and flexible cystoscopy failed to show any seeds outside the prostate.  1 small erythematous area noted in midline above trigone. I felt that this was most likely from catheter trauma--I will followup later with repeat cysto.The patient was brought to recovery  room in stable condition, having tolerated the procedure well.Alex Banks

## 2018-06-03 NOTE — Anesthesia Procedure Notes (Signed)
Procedure Name: LMA Insertion Date/Time: 06/03/2018 8:40 AM Performed by: Justice Rocher, CRNA Pre-anesthesia Checklist: Patient identified, Emergency Drugs available, Suction available and Patient being monitored Patient Re-evaluated:Patient Re-evaluated prior to induction Oxygen Delivery Method: Circle system utilized Preoxygenation: Pre-oxygenation with 100% oxygen Induction Type: IV induction Ventilation: Mask ventilation without difficulty LMA: LMA inserted LMA Size: 5.0 Number of attempts: 1 Airway Equipment and Method: Bite block Placement Confirmation: positive ETCO2 and breath sounds checked- equal and bilateral Tube secured with: Tape Dental Injury: Teeth and Oropharynx as per pre-operative assessment

## 2018-06-03 NOTE — H&P (Signed)
  H&P  Chief Complaint: Prostate cancer  History of Present Illness: 62 year old male presents for I-125 brachytherapy for curative therapy for adenocarcinoma. He will also possibly receive SpaceOAR.  He underwent ultrasound and biopsy of his prostate on 03/14/2017. At that time, PSA was 4.53. Prostatic volume 24.7 mL. PSA density 0.18. 5/12 cores, all in the right lobe, returned adenocarcinoma. All of these revealed Gleason 3+4 = 7 pattern.   There was a 4 mm right-sided prostate nodule.   IPSS 2, quality of life score 0  SHIM score 23   He underwent ultrasound and biopsy of his prostate on 03/14/2017. At that time, PSA was 4.53. Prostatic volume 24.7 mL. PSA density 0.18. 5/12 cores, all in the right lobe, returned adenocarcinoma. All of these revealed Gleason 3+4 = 7 pattern.   There was a 4 mm right-sided prostate nodule.   IPSS 2, quality of life score 0  SHIM score 23     Past Medical History:  Diagnosis Date  . Hypertension   . Prostate cancer ([place])   . TIA (transient ischemic attack) 01/2018  . Wears glasses     Past Surgical History:  Procedure Laterality Date  . PROSTATE BIOPSY      Home Medications:  Allergies as of 06/03/2018   No Known Allergies     Medication List    Notice   Cannot display discharge medications because the patient has not yet been admitted.     Allergies: No Known Allergies  Family History  Problem Relation Age of Onset  . Cancer Mother        liver  . Cancer Brother        bone    Social History:  reports that he has been smoking cigarettes. He has a 4.40 pack-year smoking history. He has never used smokeless tobacco. He reports that he does not drink alcohol or use drugs.  ROS: A complete review of systems was performed.  All systems are negative except for pertinent findings as noted.  Physical Exam:  Vital signs in last 24 hours:   Constitutional:  Alert and oriented, No acute distress Cardiovascular: Regular rate   Respiratory: Normal respiratory effort GI: Abdomen is soft, nontender, nondistended, no abdominal masses Genitourinary: No CVAT. Normal male phallus, testes are descended bilaterally and non-tender and without masses, scrotum is normal in appearance without lesions or masses, perineum is normal on inspection. Lymphatic: No lymphadenopathy Neurologic: Grossly intact, no focal deficits Psychiatric: Normal mood and affect  Laboratory Data:  No results for input(s): WBC, HGB, HCT, PLT in the last 72 hours.  No results for input(s): NA, K, CL, GLUCOSE, BUN, CALCIUM, CREATININE in the last 72 hours.  Invalid input(s): CO3   No results found for this or any previous visit (from the past 24 hour(s)). No results found for this or any previous visit (from the past 240 hour(s)).  Renal Function: Recent Labs    05/27/18 1317  CREATININE 0.95   CrCl cannot be calculated (Unknown ideal weight.).  Radiologic Imaging: No results found.  Impression/Assessment:  PCa  Plan:  I-125 brachytherapy

## 2018-06-04 ENCOUNTER — Encounter (HOSPITAL_BASED_OUTPATIENT_CLINIC_OR_DEPARTMENT_OTHER): Payer: Self-pay | Admitting: Urology

## 2018-06-11 ENCOUNTER — Telehealth: Payer: Self-pay | Admitting: *Deleted

## 2018-06-11 ENCOUNTER — Encounter: Payer: Self-pay | Admitting: Radiation Oncology

## 2018-06-11 ENCOUNTER — Telehealth: Payer: Self-pay | Admitting: Radiation Oncology

## 2018-06-11 NOTE — Telephone Encounter (Signed)
Phoned patient's home. Spoke with patient's wife, Arrie Aran. Explained her husband's return to work letter is ready. She requested I e-mail the letter to her at [email or web address]. I did as requested. Provided her with my contact number and encouraged her to call with future needs.

## 2018-06-11 NOTE — Telephone Encounter (Signed)
CALLED PATIENT TO REMIND OF POST SEED APPTS. FOR 06-12-18, SPOKE WITH PATIENT AND HE IS AWARE OF THESE APPTS.

## 2018-06-11 NOTE — Telephone Encounter (Signed)
-----   Message from Freeman Caldron, [place] sent at 06/11/2018 11:33 AM EDT ----- Regarding: FW: PHONE CALL Please call patient and let him know that we are happy to provide him with some written documentation stating that he is safe to return to work following his recent prostate seed implant.  Apparently, he returned to work yesterday and was sent home because he is "radioactive".   Please provide documentation to the patient, on our letterhead, stating that he is released back to work without restrictions following his recent seed implant.  Also, state that while he does have radioactive seeds implanted in the prostate, there is no increased risk to the general population regarding radiation exposure and he is safe to return to his job. Thank you!!!! -Ashlyn ----- Message ----- From: Kerri Perches Sent: 06/11/2018   9:09 AM EDT To: Freeman Caldron, PA-C Subject: PHONE CALL                                     Good Morning Ashlyn,   Please give this patient a call.  Mr. Ems's phone number is [phone].   Thanks,  [employer]

## 2018-06-12 ENCOUNTER — Ambulatory Visit
Admission: RE | Admit: 2018-06-12 | Discharge: 2018-06-12 | Disposition: A | Discharge status: Home / Self Care | Payer: BLUE CROSS/BLUE SHIELD | Source: Ambulatory Visit | Attending: Radiation Oncology | Admitting: Radiation Oncology

## 2018-06-12 ENCOUNTER — Encounter: Payer: Self-pay | Admitting: Radiation Oncology

## 2018-06-12 ENCOUNTER — Other Ambulatory Visit: Payer: Self-pay

## 2018-06-12 DIAGNOSIS — Z7902 Long term (current) use of antithrombotics/antiplatelets: Secondary | ICD-10-CM | POA: Insufficient documentation

## 2018-06-12 DIAGNOSIS — C61 Malignant neoplasm of prostate: Secondary | ICD-10-CM | POA: Insufficient documentation

## 2018-06-12 DIAGNOSIS — Z79899 Other long term (current) drug therapy: Secondary | ICD-10-CM | POA: Diagnosis not present

## 2018-06-12 DIAGNOSIS — Z7982 Long term (current) use of aspirin: Secondary | ICD-10-CM | POA: Insufficient documentation

## 2018-06-12 NOTE — Progress Notes (Signed)
Received patient in the clinic for post seed follow up. Weight and vitals stable. Denies pain. Pre seed IPSS 1. Post seed IPSS 14. Reports mild dysuria. Denies hematuria, urinary leakage or incontinence. Reports urinary frequency. Denies any bowel complaints. Patient did not have SpaceOar because insurance wouldn't approve to cover it thus no MRI. Scheduled to follow up with Jiles Crocker on 9/4 and [facility] on 11/22.    BP (!) 189/106 (BP Location: Right Arm)   Pulse 94   Temp 97.8 F (36.6 C) (Oral)   Resp 20   Wt 190 lb 3.2 oz (86.3 kg)   SpO2 97%   BMI 27.29 kg/m  Wt Readings from Last 3 Encounters:  06/12/18 190 lb 3.2 oz (86.3 kg)  06/03/18 189 lb 1.6 oz (85.8 kg)  02/07/18 186 lb 3.2 oz (84.5 kg)

## 2018-06-12 NOTE — Progress Notes (Signed)
  Radiation Oncology         [phone] ________________________________  Name: Alex Banks MRN: [id number]  Date: 06/12/2018  DOB: 06/24/1956  COMPLEX SIMULATION NOTE  NARRATIVE:  The patient was brought to the [facility] today following prostate seed implantation approximately one month ago.  Identity was confirmed.  All relevant records and images related to the planned course of therapy were reviewed.  Then, the patient was set-up supine.  CT images were obtained.  The CT images were loaded into the planning software.  Then the prostate and rectum were contoured.  Treatment planning then occurred.  The implanted iodine 125 seeds were identified by the physics staff for projection of radiation distribution  I have requested : 3D Simulation  I have requested a DVH of the following structures: Prostate and rectum.    ________________________________  Sheral Apley Tammi Klippel, M.D.  This document serves as a record of services personally performed by Tyler Pita, MD. It was created on his behalf by Wilburn Mylar, a trained medical scribe. The creation of this record is based on the scribe's personal observations and the provider's statements to them. This document has been checked and approved by the attending provider.

## 2018-06-12 NOTE — Progress Notes (Signed)
Radiation Oncology         [phone] ________________________________  Name: Alex Banks MRN: [id number]  Date: 06/12/2018  DOB: Jan 21, 1956  Post-Seed Follow-Up Visit Note  CC: Wenda Low, MD  Franchot Gallo, MD  Diagnosis:   62 y.o.gentleman with favorable intermediate riskStage T2badenocarcinoma of the prostate with Gleason Score of 3+4, and PSA of5.68    ICD-10-CM   1. Malignant neoplasm of prostate (HCC) C61     Interval Since Last Radiation:  9 days  06/03/18 - Insertion of radioactive I-125 seeds into the prostate gland, 145 Gy, definitive therapy.  Narrative:  The patient returns today for routine follow-up.  He is complaining of increased urinary frequency and urinary hesitation symptoms. He filled out a questionnaire regarding urinary function today providing and overall IPSS score of 14 characterizing his symptoms as moderate.  His pre-implant score was 1. He denies any bowel symptoms.  ALLERGIES:  has No Known Allergies.  Meds: Current Outpatient Medications  Medication Sig Dispense Refill  . aspirin EC 81 MG tablet Take 81 mg by mouth daily. Stopping on 8/7 for surgery on 8/14.    Marland Kitchen atorvastatin (LIPITOR) 40 MG tablet Take 1 tablet (40 mg total) by mouth daily at 6 PM. 30 tablet 0  . clopidogrel (PLAVIX) 75 MG tablet Take 1 tablet (75 mg total) by mouth daily. (Patient taking differently: Take 75 mg by mouth daily. Stopping on 05/27/18 for surgery on 8/14.) 30 tablet 0  . hydrochlorothiazide (HYDRODIURIL) 25 MG tablet Take 1 tablet (25 mg total) by mouth daily. 30 tablet 0  . lisinopril (PRINIVIL,ZESTRIL) 40 MG tablet Take 1 tablet (40 mg total) by mouth daily. 30 tablet 0  . metoprolol tartrate (LOPRESSOR) 25 MG tablet Take 1 tablet (25 mg total) by mouth 2 (two) times daily. (Patient taking differently: Take 50 mg by mouth every morning. ) 60 tablet 0  . traMADol (ULTRAM) 50 MG tablet Take 1 tablet (50 mg total) by mouth every 6 (six) hours as needed.  10 tablet 0   No current facility-administered medications for this encounter.     Physical Findings: In general this is a well appearing Caucasian man in no acute distress. He's alert and oriented x4 and appropriate throughout the examination. Cardiopulmonary assessment is negative for acute distress and he exhibits normal effort.   Lab Findings: Lab Results  Component Value Date   WBC 7.6 05/27/2018   HGB 16.0 05/27/2018   HCT 46.1 05/27/2018   MCV 89.3 05/27/2018   PLT 183 05/27/2018    Radiographic Findings:  Patient underwent CT imaging in our clinic for post implant dosimetry. The CT was reviewed by Dr. Tammi Klippel and appears to demonstrate an adequate distribution of radioactive seeds throughout the prostate gland. There are no seeds in or near the rectum. We suspect the final radiation plan and dosimetry will show appropriate coverage of the prostate gland.   Impression/Plan: The patient is recovering from the effects of radiation. His urinary symptoms should gradually improve over the next 4-6 months. We talked about this today. He is encouraged by his improvement already and is otherwise pleased with his outcome. We also talked about long-term follow-up for prostate cancer following seed implant. He understands that ongoing PSA determinations and digital rectal exams will help perform surveillance to rule out disease recurrence. He will follow up with Dr. Diona Fanti in urology. He understands what to expect with his PSA measures. Patient was also educated today about some of the long-term effects from  radiation including a small risk for rectal bleeding and possibly erectile dysfunction. We talked about some of the general management approaches to these potential complications. However, I did encourage the patient to contact our office or return at any point if he has questions or concerns related to his previous radiation and prostate cancer.    Nicholos Johns, PA-C    Tyler Pita, MD  [facility] Direct Dial: [phone]  Fax: [phone] [email or web address]  Skype  [email or web address]  This document serves as a record of services personally performed by Tyler Pita, MD and Freeman Caldron, PA-C. It was created on their behalf by Wilburn Mylar, a trained medical scribe. The creation of this record is based on the scribe's personal observations and the provider's statements to them. This document has been checked and approved by the attending provider.

## 2018-07-30 ENCOUNTER — Encounter: Payer: Self-pay | Admitting: Radiation Oncology

## 2018-07-30 ENCOUNTER — Ambulatory Visit
Admission: RE | Admit: 2018-07-30 | Discharge: 2018-07-30 | Disposition: A | Discharge status: Home / Self Care | Payer: BLUE CROSS/BLUE SHIELD | Source: Ambulatory Visit | Attending: Radiation Oncology | Admitting: Radiation Oncology

## 2018-07-30 DIAGNOSIS — Z23 Encounter for immunization: Secondary | ICD-10-CM | POA: Diagnosis not present

## 2018-07-30 DIAGNOSIS — C61 Malignant neoplasm of prostate: Secondary | ICD-10-CM | POA: Insufficient documentation

## 2018-07-30 DIAGNOSIS — E7800 Pure hypercholesterolemia, unspecified: Secondary | ICD-10-CM | POA: Diagnosis not present

## 2018-07-30 DIAGNOSIS — I10 Essential (primary) hypertension: Secondary | ICD-10-CM | POA: Diagnosis not present

## 2018-08-17 NOTE — Progress Notes (Signed)
  Radiation Oncology         [phone] ________________________________  Name: KAHLE MCQUEEN MRN: [id number]  Date: 07/30/2018  DOB: [birth year]  3D Planning Note   Prostate Brachytherapy Post-Implant Dosimetry  Diagnosis: 62 y.o. gentleman with favorable intermediate risk Stage T2b adenocarcinoma of the prostate with Gleason Score of 3+4, and PSA of 5.68  Narrative: On a previous date, RAEQUAN VANSCHAICK returned following prostate seed implantation for post implant planning. He underwent CT scan complex simulation to delineate the three-dimensional structures of the pelvis and demonstrate the radiation distribution.  Since that time, the seed localization, and complex isodose planning with dose volume histograms have now been completed.  Results:   Prostate Coverage - The dose of radiation delivered to the 90% or more of the prostate gland (D90) was 101.91% of the prescription dose. This exceeds our goal of greater than 90%. Rectal Sparing - The volume of rectal tissue receiving the prescription dose or higher was 0.0 cc. This falls under our thresholds tolerance of 1.0 cc.  Impression: The prostate seed implant appears to show adequate target coverage and appropriate rectal sparing.  Plan:  The patient will continue to follow with urology for ongoing PSA determinations. I would anticipate a high likelihood for local tumor control with minimal risk for rectal morbidity.  ________________________________  Sheral Apley Tammi Klippel, M.D.

## 2018-09-11 DIAGNOSIS — N5201 Erectile dysfunction due to arterial insufficiency: Secondary | ICD-10-CM | POA: Diagnosis not present

## 2018-09-11 DIAGNOSIS — C61 Malignant neoplasm of prostate: Secondary | ICD-10-CM | POA: Diagnosis not present

## 2018-09-14 DIAGNOSIS — I10 Essential (primary) hypertension: Secondary | ICD-10-CM | POA: Diagnosis not present

## 2018-10-27 DIAGNOSIS — I10 Essential (primary) hypertension: Secondary | ICD-10-CM | POA: Diagnosis not present

## 2018-10-27 DIAGNOSIS — C61 Malignant neoplasm of prostate: Secondary | ICD-10-CM | POA: Diagnosis not present

## 2019-03-01 DIAGNOSIS — Z1389 Encounter for screening for other disorder: Secondary | ICD-10-CM | POA: Diagnosis not present

## 2019-03-01 DIAGNOSIS — Z0000 Encounter for general adult medical examination without abnormal findings: Secondary | ICD-10-CM | POA: Diagnosis not present

## 2019-10-08 IMAGING — MR MR HEAD W/O CM
12 series · 45 of 48 positions shown · non-contrast
Comparison: Head CT 02/06/2018

CLINICAL DATA: Dizziness and double vision. History of prostate
cancer and hypertension.

EXAM:
MRI HEAD WITHOUT CONTRAST
TECHNIQUE: Multiplanar, multiecho pulse sequences of the brain and surrounding
structures were obtained without intravenous contrast.

[Series 2: T1 · sagittal · 5.0mm · 0.45mm/px · 3 of 29 slices shown (1 of 2)]
[im 1/29]
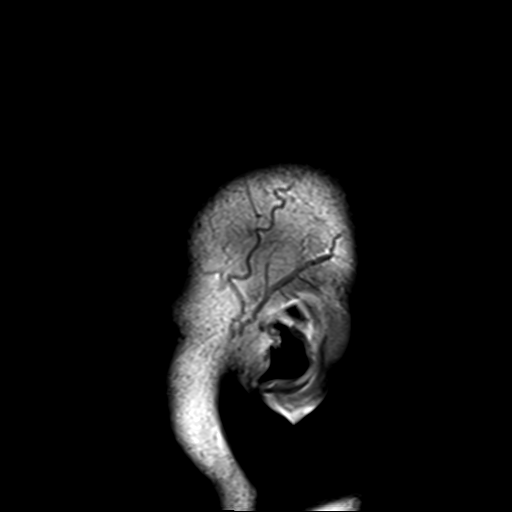
[im 15/29]
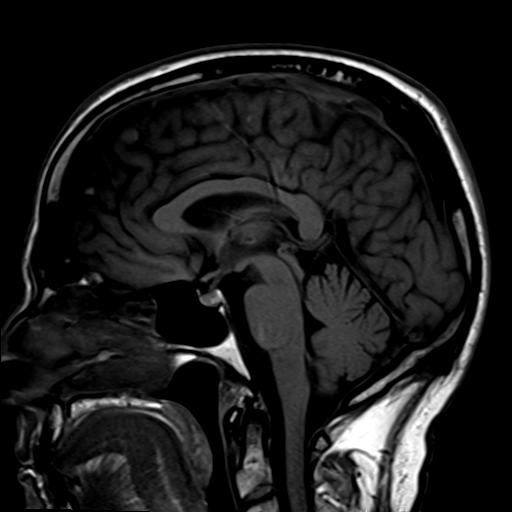
[im 29/29]
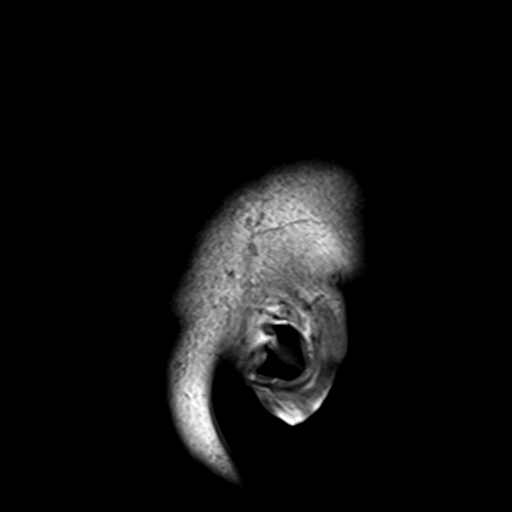

[Series 4: DWI · axial · 3.0mm · 1.80mm/px · z∈[-36,+134]mm · 4 of 57 slices shown (1 of 4)]
[im 1/57]
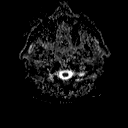
[im 19/57]
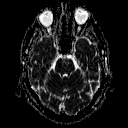
[im 38/57]
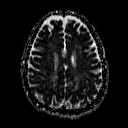
[im 57/57]
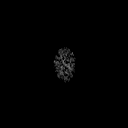

[Series 6: DWI · coronal · 3.0mm · 1.80mm/px · 4 of 50 slices shown (2 of 4)]
[im 1/50]
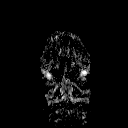
[im 17/50]
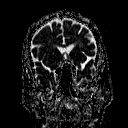
[im 33/50]
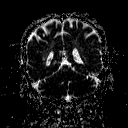
[im 50/50]
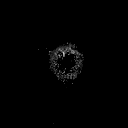

[Series 7: T2 · axial · 5.0mm · 0.60mm/px · z∈[-38,+137]mm · 2 of 28 slices shown (1 of 3)]
[im 1/28]
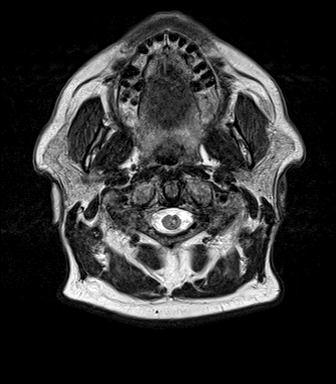
[im 28/28]
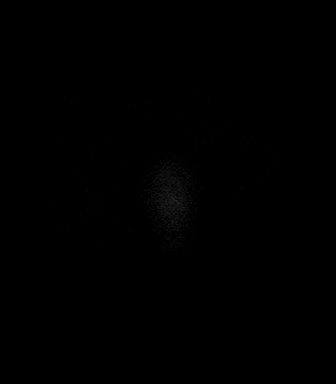

[Series 8: FLAIR · axial · 3.0mm · 0.45mm/px · z∈[-31,+130]mm · 4 of 55 slices shown (1 of 2)]
[im 1/55]
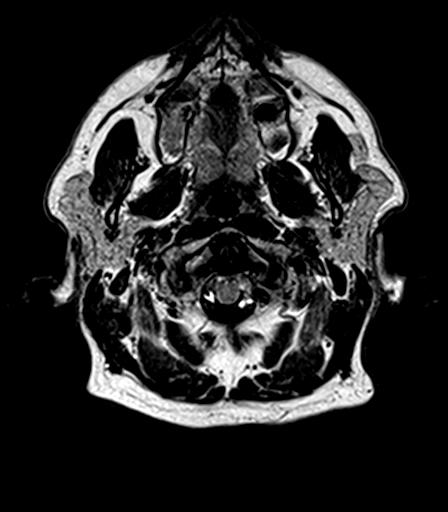
[im 19/55]
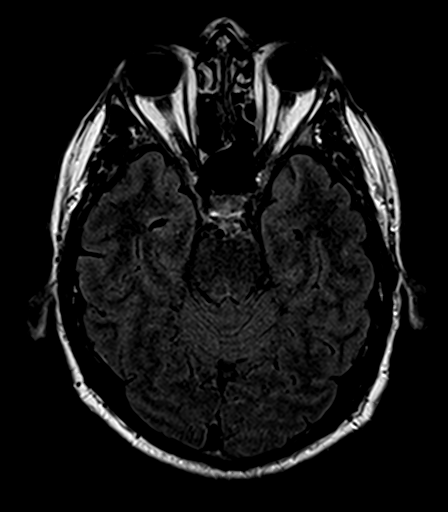
[im 37/55]
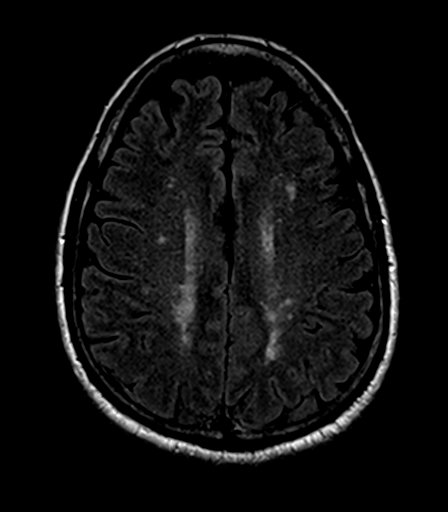
[im 55/55]
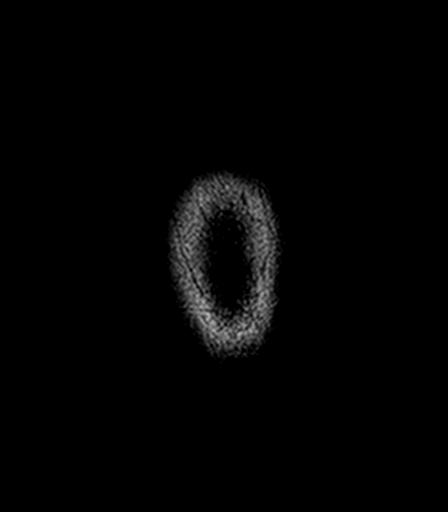

[Series 9: T2 · axial · 5.0mm · 0.45mm/px · z∈[-38,+137]mm · 2 of 28 slices shown (2 of 3)]
[im 1/28]
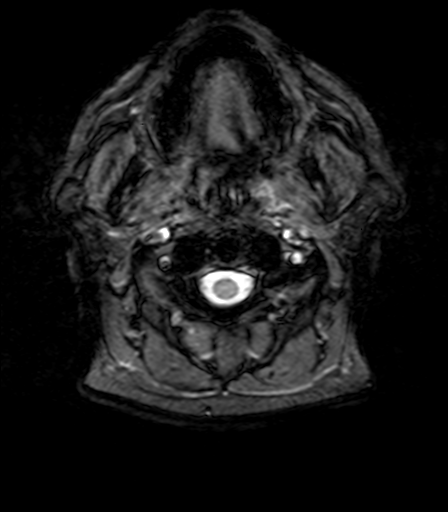
[im 28/28]
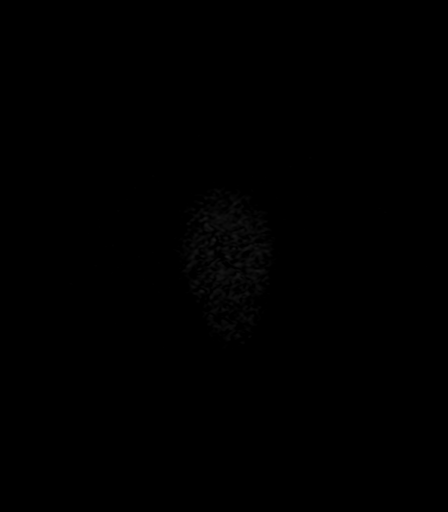

[Series 10: T1 · axial · 1.0mm · 1.00mm/px · z∈[-36,+138]mm · 10 of 176 slices shown (2 of 2)]
[im 1/176]
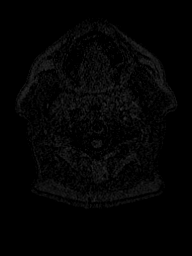
[im 15/176]
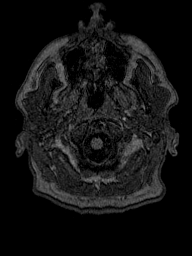
[im 30/176]
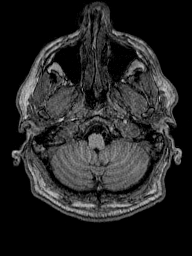
[im 44/176]
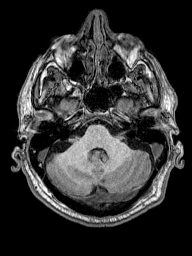
[im 59/176]
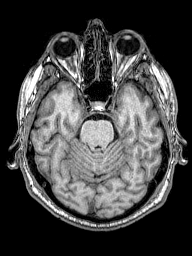
[im 73/176]
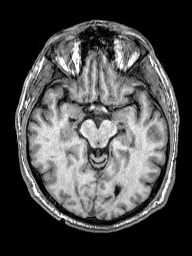
[im 103/176]
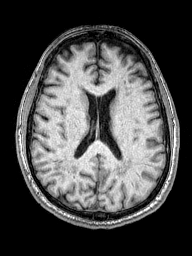
[im 117/176]
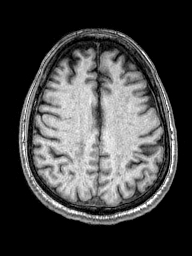
[im 146/176]
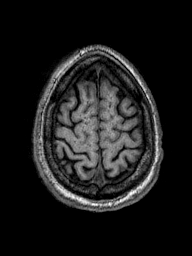
[im 176/176]
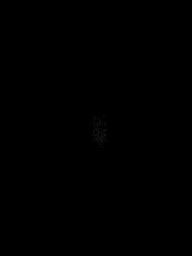

[Series 11: T2 · coronal · 5.0mm · 0.49mm/px · 2 of 31 slices shown (3 of 3)]
[im 1/31]
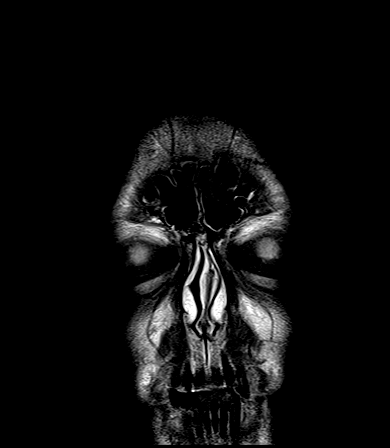
[im 31/31]
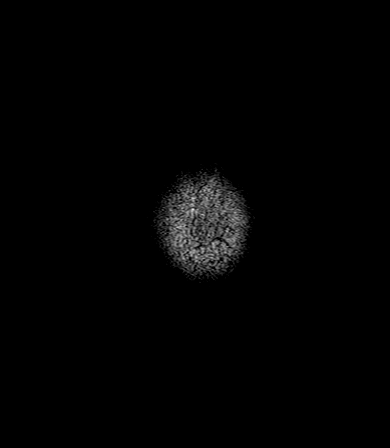

[Series 12: FLAIR · axial · 3.0mm · 0.45mm/px · z∈[-29,+129]mm · 4 of 54 slices shown (2 of 2)]
[im 1/54]
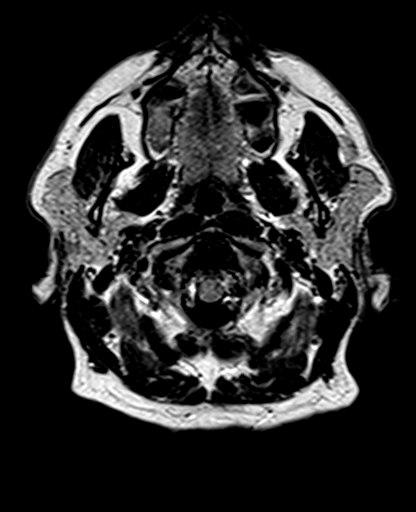
[im 18/54]
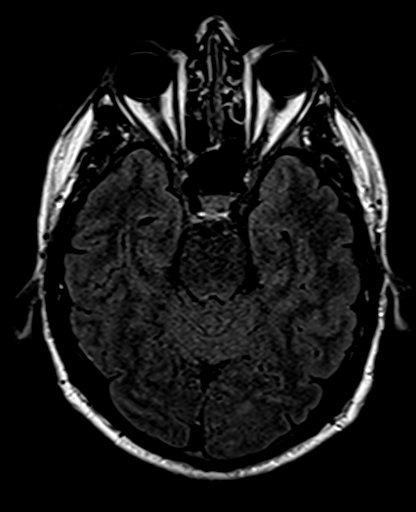
[im 36/54]
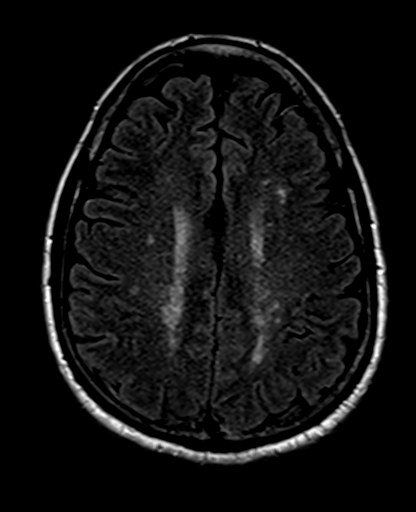
[im 54/54]
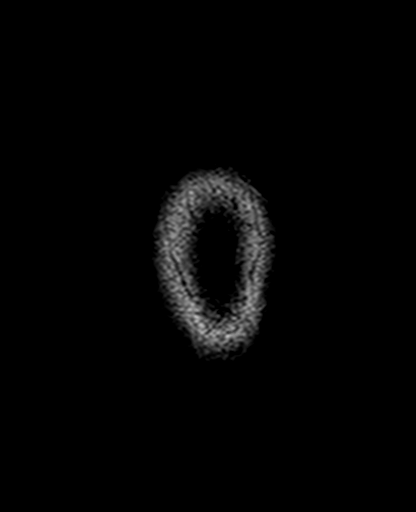

[Series 13: GRE · sagittal · 5.0mm · 0.45mm/px · 2 of 29 slices shown]
[im 1/29]
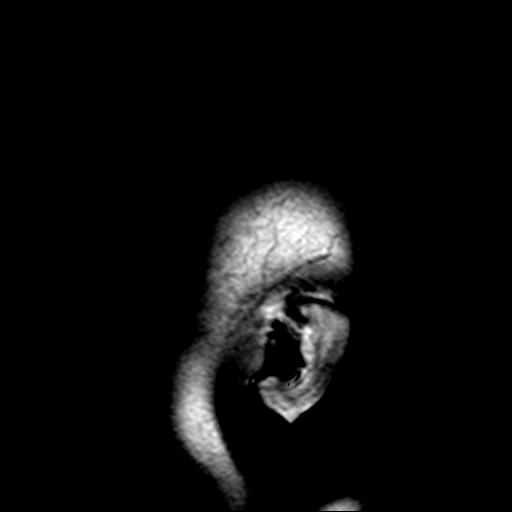
[im 29/29]
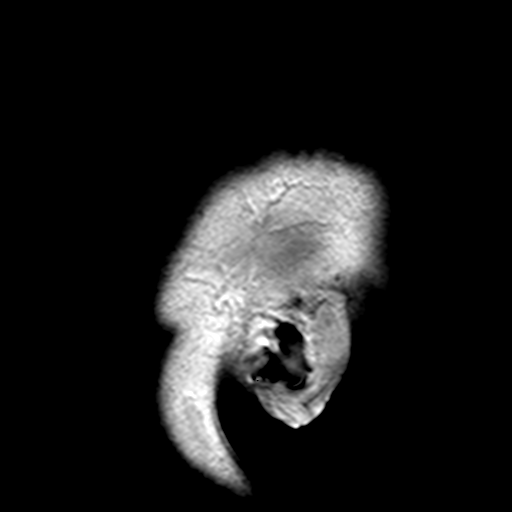

[Series 100: DWI · axial · 3.0mm · 1.80mm/px · z∈[-36,+134]mm · 4 of 57 slices shown (3 of 4)]
[im 1/57]
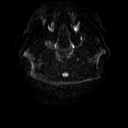
[im 19/57]
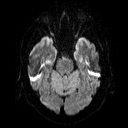
[im 38/57]
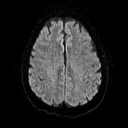
[im 57/57]
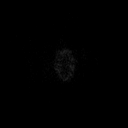

[Series 101: DWI · coronal · 3.0mm · 1.80mm/px · 4 of 50 slices shown (4 of 4)]
[im 1/50]
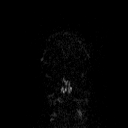
[im 17/50]
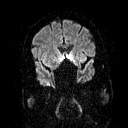
[im 33/50]
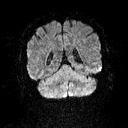
[im 50/50]
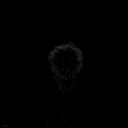

[45 of 48 positions shown; findings below may reference images not displayed]

FINDINGS: BRAIN: The midline structures are normal. There is no acute infarct
or acute hemorrhage. No mass lesion, hydrocephalus, dural
abnormality or extra-axial collection. Early confluent hyperintense
T2-weighted signal of the periventricular and deep white matter,
most commonly due to chronic ischemic microangiopathy. Old right
basal ganglia and right thalamus lacunar infarcts. No age-advanced
or lobar predominant atrophy. No chronic microhemorrhage or
superficial siderosis.

VASCULAR: Major intracranial arterial and venous sinus flow voids
are preserved.

SKULL AND UPPER CERVICAL SPINE: The visualized skull base,
calvarium, upper cervical spine and extracranial soft tissues are
normal.

SINUSES/ORBITS: No fluid levels or advanced mucosal thickening. No
mastoid or middle ear effusion. Normal orbits.
IMPRESSION: 1. No acute intracranial abnormality.
2. Chronic ischemic microangiopathy.

## 2019-11-22 DEATH — deceased
